# Patient Record
Sex: Female | Born: 1975
Health system: Southern US, Community
[De-identification: ages and names within clinical notes are randomized; demographics above are authoritative.]

## PROBLEM LIST (undated history)

## (undated) DIAGNOSIS — D649 Anemia, unspecified: Secondary | ICD-10-CM

## (undated) DIAGNOSIS — I739 Peripheral vascular disease, unspecified: Secondary | ICD-10-CM

## (undated) DIAGNOSIS — D6859 Other primary thrombophilia: Secondary | ICD-10-CM

## (undated) HISTORY — DX: Anemia, unspecified: D64.9

---

## 2012-06-02 ENCOUNTER — Encounter: Payer: Self-pay | Admitting: Family Medicine

## 2012-06-02 ENCOUNTER — Ambulatory Visit (INDEPENDENT_AMBULATORY_CARE_PROVIDER_SITE_OTHER): Payer: Self-pay | Admitting: Family Medicine

## 2012-06-02 VITALS — BP 117/70 | Temp 98.2°F | Wt 194.4 lb

## 2012-06-02 DIAGNOSIS — D6859 Other primary thrombophilia: Secondary | ICD-10-CM

## 2012-06-02 DIAGNOSIS — O99119 Other diseases of the blood and blood-forming organs and certain disorders involving the immune mechanism complicating pregnancy, unspecified trimester: Secondary | ICD-10-CM

## 2012-06-02 DIAGNOSIS — O09219 Supervision of pregnancy with history of pre-term labor, unspecified trimester: Secondary | ICD-10-CM

## 2012-06-02 DIAGNOSIS — O09299 Supervision of pregnancy with other poor reproductive or obstetric history, unspecified trimester: Secondary | ICD-10-CM

## 2012-06-02 DIAGNOSIS — Z8759 Personal history of other complications of pregnancy, childbirth and the puerperium: Secondary | ICD-10-CM

## 2012-06-02 DIAGNOSIS — O0992 Supervision of high risk pregnancy, unspecified, second trimester: Secondary | ICD-10-CM

## 2012-06-02 DIAGNOSIS — O34219 Maternal care for unspecified type scar from previous cesarean delivery: Secondary | ICD-10-CM

## 2012-06-02 DIAGNOSIS — O99891 Other specified diseases and conditions complicating pregnancy: Secondary | ICD-10-CM

## 2012-06-02 DIAGNOSIS — O09529 Supervision of elderly multigravida, unspecified trimester: Secondary | ICD-10-CM

## 2012-06-02 DIAGNOSIS — IMO0002 Reserved for concepts with insufficient information to code with codable children: Secondary | ICD-10-CM | POA: Insufficient documentation

## 2012-06-02 LAB — POCT URINALYSIS DIP (DEVICE)
Hgb urine dipstick: NEGATIVE
Leukocytes, UA: NEGATIVE
Nitrite: NEGATIVE
Protein, ur: NEGATIVE mg/dL
Urobilinogen, UA: 0.2 mg/dL (ref 0.0–1.0)
pH: 5 (ref 5.0–8.0)

## 2012-06-02 MED ORDER — VITAMIN B-6 25 MG PO TABS: 25.0000 mg | ORAL_TABLET | Freq: Two times a day (BID) | ORAL | Status: DC

## 2012-06-02 NOTE — Progress Notes (Signed)
Ob US anatomy scheduled on 9/20 @ 0930

## 2012-06-02 NOTE — Progress Notes (Signed)
Subjective:    Anne Curtis is being seen today for her first obstetrical visit.  This is a planned pregnancy. She is at [redacted]w[redacted]d gestation. She had her original prenatal care in [place] and is dated by a 7 or 8 week sono per patient. Her obstetrical history is significant for advanced maternal age, Protein S deficiency on labs from [place] 2008.  Pregnancy history fully reviewed. Stillbirth at 28 weeks, fetus with hydrocephalus per patient, delivered by elective c-section due to patient's history of abortions. Pt is on lovenox, aspirin, Vitamin B6 for coagulation disorder (protein S deficiency, borderline ANA). Pt has no history of blood clots or pulmonary embolism and no family history of blood clots or bleeding disorders. Her doctor in [place] put her on Lovenox and aspirin in previous 2 pregnancies (after 2 miscarriages) due to a presumed coagulation disorder and work-up showing low Protein S function (48%). She does not really know why she is taking these medications but says it has to do with the baby's nutrition.  Menstrual History: OB History    Grav Para Term Preterm Abortions TAB SAB Ect Mult Living   6 2 1 1 3  3   1       Menarche age: 30 Patient's last menstrual period was 12/17/2011.    The following portions of the patient's history were reviewed and updated as appropriate: allergies, current medications, past family history, past medical history, past social history, past surgical history and problem list.  Review of Systems Pertinent items are noted in HPI.    Objective:    BP 117/70  Temp 98.2 F (36.8 C)  Wt 194 lb 6.4 oz (88.179 kg)  LMP 12/17/2011  Gen:  WNWD, no distress HEENT:  NCAT, conjunctiva normal, EOMI Neck:  No masses, no thyromegaly CV:  RRR, no murmurs Lungs:  CTAB Abd:  Gravid, size =dates, non-tender, c-section scar GU:  Normal external genitalia. Vagina, cervix normal appearing. No blood or abnormal discharge. Pt very uncomfortable with exam, unable to  obtain pap but did collect cultures and wet prep. Unable to complete bimanual exam due to patient intolerance of procedure. Extrem:  No edema, non-tender Neuro:  Alert, oriented, no focal deficits.   See scanned lab results:  Karyotype for mom/dad, coagulation studies   Assessment:   36 y.o. [id number] at [redacted]w[redacted]d for initial OB visit here in [place].  - History fetal demise at 28 weeks - hydrocephalus. Mom and dad have normal karyotypes. -  Coagulation disorder - Protein S deficiency (48% function) - 2008 -  History of prior c-section (elective for fetal demise) -  Late prenatal care here - seen in [place]   Plan:    Initial labs drawn. Prenatal vitamins. Problem list reviewed and updated. Discussed VBAC vs repeat c-section. First c-section elective for fetal demise, pt worried about pain with vaginal delivery. Information given, pt considering options. Protein S deficiency - on lovenox, aspirin. Discussed with MFM. Does not feel protein S deficiency requires lovenox in pregnancy if no history of blood clot. Will Check ultrasound for any placental defects and likely stop lovenox. Continue baby aspirin okay per MFM. AFP3 discussed: too late for care. Role of ultrasound in pregnancy discussed; fetal survey: requested. Complete OB sono ordered since none available here in [place]. Amniocentesis discussed: not indicated. Follow up in 2 weeks. 30% of 30 min visit spent on counseling and coordination of care.

## 2012-06-02 NOTE — Progress Notes (Signed)
Pulse 110 Pt has some pelvic pain at times also has back pain.  White discharge with no itching.

## 2012-06-02 NOTE — Patient Instructions (Signed)
Vaginal Birth After Cesarean Delivery Vaginal birth after Cesarean delivery (VBAC) is giving birth vaginally after previously delivering a baby by a cesarean. In the past, if a woman had a Cesarean delivery, all births afterwards would be done by Cesarean delivery. This is no longer true. It can be safe for the mother to try a vaginal delivery after having a Cesarean. The final decision to have a VBAC or repeat Cesarean delivery should be between the patient and her caregiver. The risks and benefits can be discussed relative to the reason for, and the type of the previous Cesarean delivery. WOMEN WHO PLAN TO HAVE A VBAC SHOULD CHECK WITH THEIR DOCTOR TO BE SURE THAT:  The previous Cesarean was done with a low transverse uterine incision (not a vertical classical incision).   The birth canal is big enough for the baby.   There were no other operations on the uterus.   They will have an electronic fetal monitor (EFM) on at all times during labor.   An operating room would be available and ready in case an emergency Cesarean is needed.   A doctor and surgical nursing staff would be available at all times during labor to be ready to do an emergency Cesarean if necessary.   An anesthesiologist would be present in case an emergency Cesarean is needed.   The nursery is prepared and has adequate personnel and necessary equipment available to care for the baby in case of an emergency Cesarean.  BENEFITS OF VBAC:  Shorter stay in the hospital.   Lower delivery, nursery and hospital costs.   Less blood loss and need for blood transfusions.   Less fever and discomfort from major surgery.   Lower risk of blood clots.   Lower risk of infection.   Shorter recovery after going home.   Lower risk of other surgical complications, such as opening of the incision or hernia in the incision.   Decreased risk of injury to other organs.   Decreased risk for having to remove the uterus (hysterectomy).     Decreased risk for the placenta to completely or partially cover the opening of the uterus (placenta previa) with a future pregnancy.   Ability to have a larger family if desired.  RISKS OF A VBAC:  Rupture of the uterus.   Having to remove the uterus (hysterectomy) if it ruptures.   All the complications of major surgery and/or injury to other organs.   Excessive bleeding, blood clots and infection.   Lower Apgar scores (method to evaluate the newborn based on appearance, pulse, grimace, activity, and respiration) and more risks to the baby.   There is a higher risk of uterine rupture if you induce or augment labor.   There is a higher risk of uterine rupture if you use medications to ripen the cervix.  VBAC SHOULD NOT BE DONE IF:  The previous Cesarean was done with a vertical (classical) or T-shaped incision, or you do not know what kind of an incision was made.   You had a ruptured uterus.   You had surgery on your uterus.   You have medical or obstetrical problems.   There are problems with the baby.   There were two previous Cesarean deliveries and no vaginal deliveries.  OTHER FACTS TO KNOW ABOUT VBAC:  It is safe to have an epidural anesthetic with VBAC.   It is safe to turn the baby from a breech position (attempt an external cephalic version).   It is  safe to try a VBAC with twins.   Pregnancies later than 40 weeks have not been successful with VBAC.   There is an increased failure rate of a VBAC in obese pregnant women.   There is an increased failure rate with VABC if the baby weighs 8.8 pounds (4000 grams) or more.   There is an increased failure rate if the time between the Cesarean and VBAC is less than 19 months.   There is an increased failure rate if pre-eclampsia is present (high blood pressure, protein in the urine and swelling of face and extremities).   VBAC is very successful if there was a previous vaginal birth.   VBAC is very successful  when the labor starts spontaneously before the due date.   Delivery of VBAC is similar to having a normal spontaneous vaginal delivery.  It is important to discuss VBAC with your caregiver early in the pregnancy so you can understand the risks, benefits and options. It will give you time to decide what is best in your particular case relevant to the reason for your previous Cesarean delivery. It should be understood that medical changes in the mother or pregnancy may occur during the pregnancy, which make it necessary to change you or your caregiver's initial decision. The counseling, concerns and decisions should be documented in the medical record and signed by all parties. Document Released: 02/22/2007 Document Revised: 08/21/2011 Document Reviewed: 10/13/2008 [facility] Patient Information 2012 [place], [place].  Pregnancy - Second Trimester The second trimester of pregnancy (3 to 6 months) is a period of rapid growth for you and your baby. At the end of the sixth month, your baby is about 9 inches long and weighs 1 1/2 pounds. You will begin to feel the baby move between 18 and 20 weeks of the pregnancy. This is called quickening. Weight gain is faster. A clear fluid (colostrum) may leak out of your breasts. You may feel small contractions of the womb (uterus). This is known as false labor or Braxton-Hicks contractions. This is like a practice for labor when the baby is ready to be born. Usually, the problems with morning sickness have usually passed by the end of your first trimester. Some women develop small dark blotches (called cholasma, mask of pregnancy) on their face that usually goes away after the baby is born. Exposure to the sun makes the blotches worse. Acne may also develop in some pregnant women and pregnant women who have acne, may find that it goes away. PRENATAL EXAMS  Blood work may continue to be done during prenatal exams. These tests are done to check on your health and the probable  health of your baby. Blood work is used to follow your blood levels (hemoglobin). Anemia (low hemoglobin) is common during pregnancy. Iron and vitamins are given to help prevent this. You will also be checked for diabetes between 24 and 28 weeks of the pregnancy. Some of the previous blood tests may be repeated.   The size of the uterus is measured during each visit. This is to make sure that the baby is continuing to grow properly according to the dates of the pregnancy.   Your blood pressure is checked every prenatal visit. This is to make sure you are not getting toxemia.   Your urine is checked to make sure you do not have an infection, diabetes or protein in the urine.   Your weight is checked often to make sure gains are happening at the suggested rate. This  is to ensure that both you and your baby are growing normally.   Sometimes, an ultrasound is performed to confirm the proper growth and development of the baby. This is a test which bounces harmless sound waves off the baby so your caregiver can more accurately determine due dates.  Sometimes, a specialized test is done on the amniotic fluid surrounding the baby. This test is called an amniocentesis. The amniotic fluid is obtained by sticking a needle into the belly (abdomen). This is done to check the chromosomes in instances where there is a concern about possible genetic problems with the baby. It is also sometimes done near the end of pregnancy if an early delivery is required. In this case, it is done to help make sure the baby's lungs are mature enough for the baby to live outside of the womb. CHANGES OCCURING IN THE SECOND TRIMESTER OF PREGNANCY Your body goes through many changes during pregnancy. They vary from person to person. Talk to your caregiver about changes you notice that you are concerned about.  During the second trimester, you will likely have an increase in your appetite. It is normal to have cravings for certain foods.  This varies from person to person and pregnancy to pregnancy.   Your lower abdomen will begin to bulge.   You may have to urinate more often because the uterus and baby are pressing on your bladder. It is also common to get more bladder infections during pregnancy (pain with urination). You can help this by drinking lots of fluids and emptying your bladder before and after intercourse.   You may begin to get stretch marks on your hips, abdomen, and breasts. These are normal changes in the body during pregnancy. There are no exercises or medications to take that prevent this change.   You may begin to develop swollen and bulging veins (varicose veins) in your legs. Wearing support hose, elevating your feet for 15 minutes, 3 to 4 times a day and limiting salt in your diet helps lessen the problem.   Heartburn may develop as the uterus grows and pushes up against the stomach. Antacids recommended by your caregiver helps with this problem. Also, eating smaller meals 4 to 5 times a day helps.   Constipation can be treated with a stool softener or adding bulk to your diet. Drinking lots of fluids, vegetables, fruits, and whole grains are helpful.   Exercising is also helpful. If you have been very active up until your pregnancy, most of these activities can be continued during your pregnancy. If you have been less active, it is helpful to start an exercise program such as walking.   Hemorrhoids (varicose veins in the rectum) may develop at the end of the second trimester. Warm sitz baths and hemorrhoid cream recommended by your caregiver helps hemorrhoid problems.   Backaches may develop during this time of your pregnancy. Avoid heavy lifting, wear low heal shoes and practice good posture to help with backache problems.   Some pregnant women develop tingling and numbness of their hand and fingers because of swelling and tightening of ligaments in the wrist (carpel tunnel syndrome). This goes away after  the baby is born.   As your breasts enlarge, you may have to get a bigger bra. Get a comfortable, cotton, support bra. Do not get a nursing bra until the last month of the pregnancy if you will be nursing the baby.   You may get a dark line from your belly button  to the pubic area called the linea nigra.   You may develop rosy cheeks because of increase blood flow to the face.   You may develop spider looking lines of the face, neck, arms and chest. These go away after the baby is born.  HOME CARE INSTRUCTIONS   It is extremely important to avoid all smoking, herbs, alcohol, and unprescribed drugs during your pregnancy. These chemicals affect the formation and growth of the baby. Avoid these chemicals throughout the pregnancy to ensure the delivery of a healthy infant.   Most of your home care instructions are the same as suggested for the first trimester of your pregnancy. Keep your caregiver's appointments. Follow your caregiver's instructions regarding medication use, exercise and diet.   During pregnancy, you are providing food for you and your baby. Continue to eat regular, well-balanced meals. Choose foods such as meat, fish, milk and other low fat dairy products, vegetables, fruits, and whole-grain breads and cereals. Your caregiver will tell you of the ideal weight gain.   A physical sexual relationship may be continued up until near the end of pregnancy if there are no other problems. Problems could include early (premature) leaking of amniotic fluid from the membranes, vaginal bleeding, abdominal pain, or other medical or pregnancy problems.   Exercise regularly if there are no restrictions. Check with your caregiver if you are unsure of the safety of some of your exercises. The greatest weight gain will occur in the last 2 trimesters of pregnancy. Exercise will help you:   Control your weight.   Get you in shape for labor and delivery.   Lose weight after you have the baby.    Wear a good support or jogging bra for breast tenderness during pregnancy. This may help if worn during sleep. Pads or tissues may be used in the bra if you are leaking colostrum.   Do not use hot tubs, steam rooms or saunas throughout the pregnancy.   Wear your seat belt at all times when driving. This protects you and your baby if you are in an accident.   Avoid raw meat, uncooked cheese, cat litter boxes and soil used by cats. These carry germs that can cause birth defects in the baby.   The second trimester is also a good time to visit your dentist for your dental health if this has not been done yet. Getting your teeth cleaned is OK. Use a soft toothbrush. Brush gently during pregnancy.   It is easier to loose urine during pregnancy. Tightening up and strengthening the pelvic muscles will help with this problem. Practice stopping your urination while you are going to the bathroom. These are the same muscles you need to strengthen. It is also the muscles you would use as if you were trying to stop from passing gas. You can practice tightening these muscles up 10 times a set and repeating this about 3 times per day. Once you know what muscles to tighten up, do not perform these exercises during urination. It is more likely to contribute to an infection by backing up the urine.   Ask for help if you have financial, counseling or nutritional needs during pregnancy. Your caregiver will be able to offer counseling for these needs as well as refer you for other special needs.   Your skin may become oily. If so, wash your face with mild soap, use non-greasy moisturizer and oil or cream based makeup.  MEDICATIONS AND DRUG USE IN PREGNANCY  Take prenatal  vitamins as directed. The vitamin should contain 1 milligram of folic acid. Keep all vitamins out of reach of children. Only a couple vitamins or tablets containing iron may be fatal to a baby or young child when ingested.   Avoid use of all  medications, including herbs, over-the-counter medications, not prescribed or suggested by your caregiver. Only take over-the-counter or prescription medicines for pain, discomfort, or fever as directed by your caregiver. Do not use aspirin.   Let your caregiver also know about herbs you may be using.   Alcohol is related to a number of birth defects. This includes fetal alcohol syndrome. All alcohol, in any form, should be avoided completely. Smoking will cause low birth rate and premature babies.   Street or illegal drugs are very harmful to the baby. They are absolutely forbidden. A baby born to an addicted mother will be addicted at birth. The baby will go through the same withdrawal an adult does.  SEEK MEDICAL CARE IF:  You have any concerns or worries during your pregnancy. It is better to call with your questions if you feel they cannot wait, rather than worry about them. SEEK IMMEDIATE MEDICAL CARE IF:   An unexplained oral temperature above 102 F (38.9 C) develops, or as your caregiver suggests.   You have leaking of fluid from the vagina (birth canal). If leaking membranes are suspected, take your temperature and tell your caregiver of this when you call.   There is vaginal spotting, bleeding, or passing clots. Tell your caregiver of the amount and how many pads are used. Light spotting in pregnancy is common, especially following intercourse.   You develop a bad smelling vaginal discharge with a change in the color from clear to white.   You continue to feel sick to your stomach (nauseated) and have no relief from remedies suggested. You vomit blood or coffee ground-like materials.   You lose more than 2 pounds of weight or gain more than 2 pounds of weight over 1 week, or as suggested by your caregiver.   You notice swelling of your face, hands, feet, or legs.   You get exposed to [place] measles and have never had them.   You are exposed to fifth disease or chickenpox.    You develop belly (abdominal) pain. Round ligament discomfort is a common non-cancerous (benign) cause of abdominal pain in pregnancy. Your caregiver still must evaluate you.   You develop a bad headache that does not go away.   You develop fever, diarrhea, pain with urination, or shortness of breath.   You develop visual problems, blurry, or double vision.   You fall or are in a car accident or any kind of trauma.   There is mental or physical violence at home.  Document Released: 08/26/2001 Document Revised: 08/21/2011 Document Reviewed: 02/28/2009 [facility] Patient Information 2012 [place], [place].

## 2012-06-03 ENCOUNTER — Telehealth: Payer: Self-pay | Admitting: *Deleted

## 2012-06-03 LAB — OBSTETRIC PANEL
Basophils Absolute: 0 10*3/uL (ref 0.0–0.1)
Basophils Relative: 0 % (ref 0–1)
Eosinophils Absolute: 0.2 10*3/uL (ref 0.0–0.7)
Eosinophils Relative: 2 % (ref 0–5)
MCH: 19.9 pg — ABNORMAL LOW (ref 26.0–34.0)
MCV: 65.4 fL — ABNORMAL LOW (ref 78.0–100.0)
Platelets: 334 10*3/uL (ref 150–400)
RDW: 18.3 % — ABNORMAL HIGH (ref 11.5–15.5)

## 2012-06-03 LAB — GLUCOSE TOLERANCE, 1 HOUR (50G) W/O FASTING: Glucose, 1 Hour GTT: 141 mg/dL — ABNORMAL HIGH (ref 70–140)

## 2012-06-03 LAB — GC/CHLAMYDIA PROBE AMP, GENITAL
Chlamydia, DNA Probe: NEGATIVE
GC Probe Amp, Genital: NEGATIVE

## 2012-06-03 NOTE — Telephone Encounter (Addendum)
Called client with Anne Curtis [phone] and notified her she did not pass 1 hour glucose screen and needs 3 hr screen - explained must be npo except sips of water if needed- agreed to appointment for Monday. Also discussed she is still anemic- patient states taking iron once a day- instructed her to take iron 2 to 3 times a day with meals. Also discussed may cause constipation and if it does let us know, will order her a medication.  Patient voices understanding, all questions answered.

## 2012-06-03 NOTE — Telephone Encounter (Deleted)
Message copied by Gerome Apley on Thu Jun 03, 2012  3:37 PM ------      Message from: FERRY, [place]      Created: Thu Jun 03, 2012 10:52 AM       Failed 1 hour GTT. Patient needs 3 hour GTT. Also, patient is still anemic. Needs to be on iron bid to tid. Thanks.

## 2012-06-03 NOTE — Telephone Encounter (Signed)
Message copied by Gerome Apley on Thu Jun 03, 2012  2:56 PM ------      Message from: FERRY, [place]      Created: Thu Jun 03, 2012 10:52 AM       Failed 1 hour GTT. Patient needs 3 hour GTT. Also, patient is still anemic. Needs to be on iron bid to tid. Thanks.

## 2012-06-04 ENCOUNTER — Ambulatory Visit (HOSPITAL_COMMUNITY)
Admission: RE | Admit: 2012-06-04 | Discharge: 2012-06-04 | Disposition: A | Discharge status: Home / Self Care | Payer: Medicaid Other | Source: Ambulatory Visit | Attending: Family Medicine | Admitting: Family Medicine

## 2012-06-04 DIAGNOSIS — Z8759 Personal history of other complications of pregnancy, childbirth and the puerperium: Secondary | ICD-10-CM

## 2012-06-04 DIAGNOSIS — O09299 Supervision of pregnancy with other poor reproductive or obstetric history, unspecified trimester: Secondary | ICD-10-CM | POA: Insufficient documentation

## 2012-06-04 DIAGNOSIS — O0992 Supervision of high risk pregnancy, unspecified, second trimester: Secondary | ICD-10-CM

## 2012-06-04 DIAGNOSIS — Z8751 Personal history of pre-term labor: Secondary | ICD-10-CM | POA: Insufficient documentation

## 2012-06-04 DIAGNOSIS — O358XX0 Maternal care for other (suspected) fetal abnormality and damage, not applicable or unspecified: Secondary | ICD-10-CM | POA: Insufficient documentation

## 2012-06-04 LAB — CULTURE, OB URINE: Colony Count: 50000

## 2012-06-04 LAB — WET PREP, GENITAL

## 2012-06-04 LAB — HEMOGLOBINOPATHY EVALUATION: Hgb F Quant: 0 % (ref 0.0–2.0)

## 2012-06-07 ENCOUNTER — Other Ambulatory Visit: Payer: Self-pay

## 2012-06-07 DIAGNOSIS — R7302 Impaired glucose tolerance (oral): Secondary | ICD-10-CM

## 2012-06-08 LAB — GLUCOSE TOLERANCE, 3 HOURS: Glucose Tolerance, 2 hour: 168 mg/dL — ABNORMAL HIGH (ref 70–164)

## 2012-06-17 ENCOUNTER — Ambulatory Visit (INDEPENDENT_AMBULATORY_CARE_PROVIDER_SITE_OTHER): Payer: Self-pay | Admitting: Family Medicine

## 2012-06-17 ENCOUNTER — Encounter: Payer: Self-pay | Admitting: Family Medicine

## 2012-06-17 VITALS — BP 98/64 | Temp 97.8°F | Ht 64.75 in | Wt 195.9 lb

## 2012-06-17 DIAGNOSIS — IMO0002 Reserved for concepts with insufficient information to code with codable children: Secondary | ICD-10-CM

## 2012-06-17 DIAGNOSIS — O09529 Supervision of elderly multigravida, unspecified trimester: Secondary | ICD-10-CM

## 2012-06-17 DIAGNOSIS — Z23 Encounter for immunization: Secondary | ICD-10-CM

## 2012-06-17 DIAGNOSIS — O34219 Maternal care for unspecified type scar from previous cesarean delivery: Secondary | ICD-10-CM

## 2012-06-17 DIAGNOSIS — O0990 Supervision of high risk pregnancy, unspecified, unspecified trimester: Secondary | ICD-10-CM | POA: Insufficient documentation

## 2012-06-17 LAB — POCT URINALYSIS DIP (DEVICE)
Bilirubin Urine: NEGATIVE
Ketones, ur: NEGATIVE mg/dL
Leukocytes, UA: NEGATIVE
Protein, ur: NEGATIVE mg/dL
Specific Gravity, Urine: 1.02 (ref 1.005–1.030)
pH: 5.5 (ref 5.0–8.0)

## 2012-06-17 MED ORDER — INFLUENZA VIRUS VACC SPLIT PF IM SUSP
0.5000 mL | Freq: Once | INTRAMUSCULAR | Status: AC
  Administered 2012-06-17: 0.5 mL via INTRAMUSCULAR

## 2012-06-17 NOTE — Patient Instructions (Addendum)
Vaginal Birth After Cesarean Delivery Vaginal birth after Cesarean delivery (VBAC) is giving birth vaginally after previously delivering a baby by a cesarean. In the past, if a woman had a Cesarean delivery, all births afterwards would be done by Cesarean delivery. This is no longer true. It can be safe for the mother to try a vaginal delivery after having a Cesarean. The final decision to have a VBAC or repeat Cesarean delivery should be between the patient and her caregiver. The risks and benefits can be discussed relative to the reason for, and the type of the previous Cesarean delivery. WOMEN WHO PLAN TO HAVE A VBAC SHOULD CHECK WITH THEIR DOCTOR TO BE SURE THAT:  The previous Cesarean was done with a low transverse uterine incision (not a vertical classical incision).  The birth canal is big enough for the baby.  There were no other operations on the uterus.  They will have an electronic fetal monitor (EFM) on at all times during labor.  An operating room would be available and ready in case an emergency Cesarean is needed.  A doctor and surgical nursing staff would be available at all times during labor to be ready to do an emergency Cesarean if necessary.  An anesthesiologist would be present in case an emergency Cesarean is needed.  The nursery is prepared and has adequate personnel and necessary equipment available to care for the baby in case of an emergency Cesarean. BENEFITS OF VBAC:  Shorter stay in the hospital.  Lower delivery, nursery and hospital costs.  Less blood loss and need for blood transfusions.  Less fever and discomfort from major surgery.  Lower risk of blood clots.  Lower risk of infection.  Shorter recovery after going home.  Lower risk of other surgical complications, such as opening of the incision or hernia in the incision.  Decreased risk of injury to other organs.  Decreased risk for having to remove the uterus (hysterectomy).  Decreased risk  for the placenta to completely or partially cover the opening of the uterus (placenta previa) with a future pregnancy.  Ability to have a larger family if desired. RISKS OF A VBAC:  Rupture of the uterus.  Having to remove the uterus (hysterectomy) if it ruptures.  All the complications of major surgery and/or injury to other organs.  Excessive bleeding, blood clots and infection.  Lower Apgar scores (method to evaluate the newborn based on appearance, pulse, grimace, activity, and respiration) and more risks to the baby.  There is a higher risk of uterine rupture if you induce or augment labor.  There is a higher risk of uterine rupture if you use medications to ripen the cervix. VBAC SHOULD NOT BE DONE IF:  The previous Cesarean was done with a vertical (classical) or T-shaped incision, or you do not know what kind of an incision was made.  You had a ruptured uterus.  You had surgery on your uterus.  You have medical or obstetrical problems.  There are problems with the baby.  There were two previous Cesarean deliveries and no vaginal deliveries. OTHER FACTS TO KNOW ABOUT VBAC:  It is safe to have an epidural anesthetic with VBAC.  It is safe to turn the baby from a breech position (attempt an external cephalic version).  It is safe to try a VBAC with twins.  Pregnancies later than 40 weeks have not been successful with VBAC.  There is an increased failure rate of a VBAC in obese pregnant women.  There is   an increased failure rate with VABC if the baby weighs 8.8 pounds (4000 grams) or more.  There is an increased failure rate if the time between the Cesarean and VBAC is less than 19 months.  There is an increased failure rate if pre-eclampsia is present (high blood pressure, protein in the urine and swelling of face and extremities).  VBAC is very successful if there was a previous vaginal birth.  VBAC is very successful when the labor starts spontaneously before  the due date.  Delivery of VBAC is similar to having a normal spontaneous vaginal delivery. It is important to discuss VBAC with your caregiver early in the pregnancy so you can understand the risks, benefits and options. It will give you time to decide what is best in your particular case relevant to the reason for your previous Cesarean delivery. It should be understood that medical changes in the mother or pregnancy may occur during the pregnancy, which make it necessary to change you or your caregiver's initial decision. The counseling, concerns and decisions should be documented in the medical record and signed by all parties. Document Released: 02/22/2007 Document Revised: 11/24/2011 Document Reviewed: 10/13/2008 [employer] Patient Information 2013 [employer].  Pregnancy - Second Trimester The second trimester of pregnancy (3 to 6 months) is a period of rapid growth for you and your baby. At the end of the sixth month, your baby is about 9 inches long and weighs 1 1/2 pounds. You will begin to feel the baby move between 18 and 20 weeks of the pregnancy. This is called quickening. Weight gain is faster. A clear fluid (colostrum) may leak out of your breasts. You may feel small contractions of the womb (uterus). This is known as false labor or Braxton-Hicks contractions. This is like a practice for labor when the baby is ready to be born. Usually, the problems with morning sickness have usually passed by the end of your first trimester. Some women develop small dark blotches (called cholasma, mask of pregnancy) on their face that usually goes away after the baby is born. Exposure to the sun makes the blotches worse. Acne may also develop in some pregnant women and pregnant women who have acne, may find that it goes away. PRENATAL EXAMS  Blood work may continue to be done during prenatal exams. These tests are done to check on your health and the probable health of your baby. Blood work is used to  follow your blood levels (hemoglobin). Anemia (low hemoglobin) is common during pregnancy. Iron and vitamins are given to help prevent this. You will also be checked for diabetes between 24 and 28 weeks of the pregnancy. Some of the previous blood tests may be repeated.  The size of the uterus is measured during each visit. This is to make sure that the baby is continuing to grow properly according to the dates of the pregnancy.  Your blood pressure is checked every prenatal visit. This is to make sure you are not getting toxemia.  Your urine is checked to make sure you do not have an infection, diabetes or protein in the urine.  Your weight is checked often to make sure gains are happening at the suggested rate. This is to ensure that both you and your baby are growing normally.  Sometimes, an ultrasound is performed to confirm the proper growth and development of the baby. This is a test which bounces harmless sound waves off the baby so your caregiver can more accurately determine due dates.   Sometimes, a specialized test is done on the amniotic fluid surrounding the baby. This test is called an amniocentesis. The amniotic fluid is obtained by sticking a needle into the belly (abdomen). This is done to check the chromosomes in instances where there is a concern about possible genetic problems with the baby. It is also sometimes done near the end of pregnancy if an early delivery is required. In this case, it is done to help make sure the baby's lungs are mature enough for the baby to live outside of the womb. CHANGES OCCURING IN THE SECOND TRIMESTER OF PREGNANCY Your body goes through many changes during pregnancy. They vary from person to person. Talk to your caregiver about changes you notice that you are concerned about.  During the second trimester, you will likely have an increase in your appetite. It is normal to have cravings for certain foods. This varies from person to person and pregnancy  to pregnancy.  Your lower abdomen will begin to bulge.  You may have to urinate more often because the uterus and baby are pressing on your bladder. It is also common to get more bladder infections during pregnancy (pain with urination). You can help this by drinking lots of fluids and emptying your bladder before and after intercourse.  You may begin to get stretch marks on your hips, abdomen, and breasts. These are normal changes in the body during pregnancy. There are no exercises or medications to take that prevent this change.  You may begin to develop swollen and bulging veins (varicose veins) in your legs. Wearing support hose, elevating your feet for 15 minutes, 3 to 4 times a day and limiting salt in your diet helps lessen the problem.  Heartburn may develop as the uterus grows and pushes up against the stomach. Antacids recommended by your caregiver helps with this problem. Also, eating smaller meals 4 to 5 times a day helps.  Constipation can be treated with a stool softener or adding bulk to your diet. Drinking lots of fluids, vegetables, fruits, and whole grains are helpful.  Exercising is also helpful. If you have been very active up until your pregnancy, most of these activities can be continued during your pregnancy. If you have been less active, it is helpful to start an exercise program such as walking.  Hemorrhoids (varicose veins in the rectum) may develop at the end of the second trimester. Warm sitz baths and hemorrhoid cream recommended by your caregiver helps hemorrhoid problems.  Backaches may develop during this time of your pregnancy. Avoid heavy lifting, wear low heal shoes and practice good posture to help with backache problems.  Some pregnant women develop tingling and numbness of their hand and fingers because of swelling and tightening of ligaments in the wrist (carpel tunnel syndrome). This goes away after the baby is born.  As your breasts enlarge, you may  have to get a bigger bra. Get a comfortable, cotton, support bra. Do not get a nursing bra until the last month of the pregnancy if you will be nursing the baby.  You may get a dark line from your belly button to the pubic area called the linea nigra.  You may develop rosy cheeks because of increase blood flow to the face.  You may develop spider looking lines of the face, neck, arms and chest. These go away after the baby is born. HOME CARE INSTRUCTIONS   It is extremely important to avoid all smoking, herbs, alcohol, and unprescribed drugs during   your pregnancy. These chemicals affect the formation and growth of the baby. Avoid these chemicals throughout the pregnancy to ensure the delivery of a healthy infant.  Most of your home care instructions are the same as suggested for the first trimester of your pregnancy. Keep your caregiver's appointments. Follow your caregiver's instructions regarding medication use, exercise and diet.  During pregnancy, you are providing food for you and your baby. Continue to eat regular, well-balanced meals. Choose foods such as meat, fish, milk and other low fat dairy products, vegetables, fruits, and whole-grain breads and cereals. Your caregiver will tell you of the ideal weight gain.  A physical sexual relationship may be continued up until near the end of pregnancy if there are no other problems. Problems could include early (premature) leaking of amniotic fluid from the membranes, vaginal bleeding, abdominal pain, or other medical or pregnancy problems.  Exercise regularly if there are no restrictions. Check with your caregiver if you are unsure of the safety of some of your exercises. The greatest weight gain will occur in the last 2 trimesters of pregnancy. Exercise will help you:  Control your weight.  Get you in shape for labor and delivery.  Lose weight after you have the baby.  Wear a good support or jogging bra for breast tenderness during  pregnancy. This may help if worn during sleep. Pads or tissues may be used in the bra if you are leaking colostrum.  Do not use hot tubs, steam rooms or saunas throughout the pregnancy.  Wear your seat belt at all times when driving. This protects you and your baby if you are in an accident.  Avoid raw meat, uncooked cheese, cat litter boxes and soil used by cats. These carry germs that can cause birth defects in the baby.  The second trimester is also a good time to visit your dentist for your dental health if this has not been done yet. Getting your teeth cleaned is OK. Use a soft toothbrush. Brush gently during pregnancy.  It is easier to loose urine during pregnancy. Tightening up and strengthening the pelvic muscles will help with this problem. Practice stopping your urination while you are going to the bathroom. These are the same muscles you need to strengthen. It is also the muscles you would use as if you were trying to stop from passing gas. You can practice tightening these muscles up 10 times a set and repeating this about 3 times per day. Once you know what muscles to tighten up, do not perform these exercises during urination. It is more likely to contribute to an infection by backing up the urine.  Ask for help if you have financial, counseling or nutritional needs during pregnancy. Your caregiver will be able to offer counseling for these needs as well as refer you for other special needs.  Your skin may become oily. If so, wash your face with mild soap, use non-greasy moisturizer and oil or cream based makeup. MEDICATIONS AND DRUG USE IN PREGNANCY  Take prenatal vitamins as directed. The vitamin should contain 1 milligram of folic acid. Keep all vitamins out of reach of children. Only a couple vitamins or tablets containing iron may be fatal to a baby or young child when ingested.  Avoid use of all medications, including herbs, over-the-counter medications, not prescribed or  suggested by your caregiver. Only take over-the-counter or prescription medicines for pain, discomfort, or fever as directed by your caregiver. Do not use aspirin.  Let your caregiver also   know about herbs you may be using.  Alcohol is related to a number of birth defects. This includes fetal alcohol syndrome. All alcohol, in any form, should be avoided completely. Smoking will cause low birth rate and premature babies.  Street or illegal drugs are very harmful to the baby. They are absolutely forbidden. A baby born to an addicted mother will be addicted at birth. The baby will go through the same withdrawal an adult does. SEEK MEDICAL CARE IF:  You have any concerns or worries during your pregnancy. It is better to call with your questions if you feel they cannot wait, rather than worry about them. SEEK IMMEDIATE MEDICAL CARE IF:   An unexplained oral temperature above 102 F (38.9 C) develops, or as your caregiver suggests.  You have leaking of fluid from the vagina (birth canal). If leaking membranes are suspected, take your temperature and tell your caregiver of this when you call.  There is vaginal spotting, bleeding, or passing clots. Tell your caregiver of the amount and how many pads are used. Light spotting in pregnancy is common, especially following intercourse.  You develop a bad smelling vaginal discharge with a change in the color from clear to white.  You continue to feel sick to your stomach (nauseated) and have no relief from remedies suggested. You vomit blood or coffee ground-like materials.  You lose more than 2 pounds of weight or gain more than 2 pounds of weight over 1 week, or as suggested by your caregiver.  You notice swelling of your face, hands, feet, or legs.  You get exposed to German measles and have never had them.  You are exposed to fifth disease or chickenpox.  You develop belly (abdominal) pain. Round ligament discomfort is a common non-cancerous  (benign) cause of abdominal pain in pregnancy. Your caregiver still must evaluate you.  You develop a bad headache that does not go away.  You develop fever, diarrhea, pain with urination, or shortness of breath.  You develop visual problems, blurry, or double vision.  You fall or are in a car accident or any kind of trauma.  There is mental or physical violence at home. Document Released: 08/26/2001 Document Revised: 11/24/2011 Document Reviewed: 02/28/2009 [employer] Patient Information 2013 [employer].  Breastfeeding Deciding to breastfeed is one of the best choices you can make for you and your baby. The information that follows gives a brief overview of the benefits of breastfeeding as well as common topics surrounding breastfeeding. BENEFITS OF BREASTFEEDING For the baby  The first milk (colostrum) helps the baby's digestive system function better.   There are antibodies in the mother's milk that help the baby fight off infections.   The baby has a lower incidence of asthma, allergies, and sudden infant death syndrome (SIDS).   The nutrients in breast milk are better for the baby than infant formulas, and breast milk helps the baby's brain grow better.   Babies who breastfeed have less gas, colic, and constipation.  For the mother  Breastfeeding helps develop a very special bond between the mother and her baby.   Breastfeeding is convenient, always available at the correct temperature, and costs nothing.   Breastfeeding burns calories in the mother and helps her lose weight that was gained during pregnancy.   Breastfeeding makes the uterus contract back down to normal size faster and slows bleeding following delivery.   Breastfeeding mothers have a lower risk of developing breast cancer.  BREASTFEEDING FREQUENCY  A healthy, full-term   baby may breastfeed as often as every hour or space his or her feedings to every 3 hours.   Watch your baby for signs of  hunger. Nurse your baby if he or she shows signs of hunger. How often you nurse will vary from baby to baby.   Nurse as often as the baby requests, or when you feel the need to reduce the fullness of your breasts.   Awaken the baby if it has been 3 4 hours since the last feeding.   Frequent feeding will help the mother make more milk and will help prevent problems, such as sore nipples and engorgement of the breasts.  BABY'S POSITION AT THE BREAST  Whether lying down or sitting, be sure that the baby's tummy is facing your tummy.   Support the breast with 4 fingers underneath the breast and the thumb above. Make sure your fingers are well away from the nipple and baby's mouth.   Stroke the baby's lips gently with your finger or nipple.   When the baby's mouth is open wide enough, place all of your nipple and as much of the areola as possible into your baby's mouth.   Pull the baby in close so the tip of the nose and the baby's cheeks touch the breast during the feeding.  FEEDINGS AND SUCTION  The length of each feeding varies from baby to baby and from feeding to feeding.   The baby must suck about 2 3 minutes for your milk to get to him or her. This is called a "let down." For this reason, allow the baby to feed on each breast as long as he or she wants. Your baby will end the feeding when he or she has received the right balance of nutrients.   To break the suction, put your finger into the corner of the baby's mouth and slide it between his or her gums before removing your breast from his or her mouth. This will help prevent sore nipples.  HOW TO TELL WHETHER YOUR BABY IS GETTING ENOUGH BREAST MILK. Wondering whether or not your baby is getting enough milk is a common concern among mothers. You can be assured that your baby is getting enough milk if:   Your baby is actively sucking and you hear swallowing.   Your baby seems relaxed and satisfied after a feeding.    Your baby nurses at least 8 12 times in a 24 hour time period. Nurse your baby until he or she unlatches or falls asleep at the first breast (at least 10 20 minutes), then offer the second side.   Your baby is wetting 5 6 disposable diapers (6 8 cloth diapers) in a 24 hour period by 5 6 days of age.   Your baby is having at least 3 4 stools every 24 hours for the first 6 weeks. The stool should be soft and yellow.   Your baby should gain 4 7 ounces per week after he or she is 4 days old.   Your breasts feel softer after nursing.  REDUCING BREAST ENGORGEMENT  In the first week after your baby is born, you may experience signs of breast engorgement. When breasts are engorged, they feel heavy, warm, full, and may be tender to the touch. You can reduce engorgement if you:   Nurse frequently, every 2 3 hours. Mothers who breastfeed early and often have fewer problems with engorgement.   Place light ice packs on your breasts for 10 20 minutes   between feedings. This reduces swelling. Wrap the ice packs in a lightweight towel to protect your skin. Bags of frozen vegetables work well for this purpose.   Take a warm shower or apply warm, moist heat to your breast for 5 10 minutes just before each feeding. This increases circulation and helps the milk flow.   Gently massage your breast before and during the feeding. Using your finger tips, massage from the chest wall towards your nipple in a circular motion.   Make sure that the baby empties at least one breast at every feeding before switching sides.   Use a breast pump to empty the breasts if your baby is sleepy or not nursing well. You may also want to pump if you are returning to work oryou feel you are getting engorged.   Avoid bottle feeds, pacifiers, or supplemental feedings of water or juice in place of breastfeeding. Breast milk is all the food your baby needs. It is not necessary for your baby to have water or formula. In  fact, to help your breasts make more milk, it is best not to give your baby supplemental feedings during the early weeks.   Be sure the baby is latched on and positioned properly while breastfeeding.   Wear a supportive bra, avoiding underwire styles.   Eat a balanced diet with enough fluids.   Rest often, relax, and take your prenatal vitamins to prevent fatigue, stress, and anemia.  If you follow these suggestions, your engorgement should improve in 24 48 hours. If you are still experiencing difficulty, call your lactation consultant or caregiver.  CARING FOR YOURSELF Take care of your breasts  Bathe or shower daily.   Avoid using soap on your nipples.   Start feedings on your left breast at one feeding and on your right breast at the next feeding.   You will notice an increase in your milk supply 2 5 days after delivery. You may feel some discomfort from engorgement, which makes your breasts very firm and often tender. Engorgement "peaks" out within 24 48 hours. In the meantime, apply warm moist towels to your breasts for 5 10 minutes before feeding. Gentle massage and expression of some milk before feeding will soften your breasts, making it easier for your baby to latch on.   Wear a well-fitting nursing bra, and air dry your nipples for a 3 4minutes after each feeding.   Only use cotton bra pads.   Only use pure lanolin on your nipples after nursing. You do not need to wash it off before feeding the baby again. Another option is to express a few drops of breast milk and gently massage it into your nipples.  Take care of yourself  Eat well-balanced meals and nutritious snacks.   Drinking milk, fruit juice, and water to satisfy your thirst (about 8 glasses a day).   Get plenty of rest.  Avoid foods that you notice affect the baby in a bad way.  SEEK MEDICAL CARE IF:   You have difficulty with breastfeeding and need help.   You have a hard, red, sore area on  your breast that is accompanied by a fever.   Your baby is too sleepy to eat well or is having trouble sleeping.   Your baby is wetting less than 6 diapers a day, by 5 days of age.   Your baby's skin or white part of his or her eyes is more yellow than it was in the hospital.   You   feel depressed.  Document Released: 09/01/2005 Document Revised: 03/02/2012 Document Reviewed: 11/30/2011 [employer] Patient Information 2013 [employer].  

## 2012-06-17 NOTE — Progress Notes (Signed)
P=101. 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 Discussed BMI and appropriate weight gain.

## 2012-06-17 NOTE — Progress Notes (Signed)
Pt. Is doing well. Discussed TOLAC vs. RCS--still considering. Desires genetics for AMA Flu shot today Continue lovenox-due to SAB x 3 including one in second trimester and h/o IUFD.

## 2012-07-01 ENCOUNTER — Ambulatory Visit (INDEPENDENT_AMBULATORY_CARE_PROVIDER_SITE_OTHER): Payer: Self-pay | Admitting: Obstetrics & Gynecology

## 2012-07-01 VITALS — BP 96/62 | Temp 97.6°F

## 2012-07-01 DIAGNOSIS — D649 Anemia, unspecified: Secondary | ICD-10-CM

## 2012-07-01 DIAGNOSIS — IMO0002 Reserved for concepts with insufficient information to code with codable children: Secondary | ICD-10-CM

## 2012-07-01 DIAGNOSIS — Z8759 Personal history of other complications of pregnancy, childbirth and the puerperium: Secondary | ICD-10-CM

## 2012-07-01 DIAGNOSIS — O09299 Supervision of pregnancy with other poor reproductive or obstetric history, unspecified trimester: Secondary | ICD-10-CM

## 2012-07-01 DIAGNOSIS — O09529 Supervision of elderly multigravida, unspecified trimester: Secondary | ICD-10-CM

## 2012-07-01 LAB — POCT URINALYSIS DIP (DEVICE)
Glucose, UA: NEGATIVE mg/dL
Hgb urine dipstick: NEGATIVE
Nitrite: NEGATIVE
Protein, ur: NEGATIVE mg/dL
Specific Gravity, Urine: 1.02 (ref 1.005–1.030)
Urobilinogen, UA: 0.2 mg/dL (ref 0.0–1.0)
pH: 6 (ref 5.0–8.0)

## 2012-07-01 LAB — GLUCOSE, CAPILLARY: Glucose-Capillary: 93 mg/dL (ref 70–99)

## 2012-07-01 MED ORDER — TETANUS-DIPHTH-ACELL PERTUSSIS 5-2.5-18.5 LF-MCG/0.5 IM SUSP
0.5000 mL | Freq: Once | INTRAMUSCULAR | Status: AC
  Administered 2012-07-01: 0.5 mL via INTRAMUSCULAR

## 2012-07-01 MED ORDER — DOCUSATE SODIUM 100 MG PO CAPS: 100.0000 mg | ORAL_CAPSULE | Freq: Two times a day (BID) | ORAL | Status: DC

## 2012-07-01 MED ORDER — FERROUS SULFATE 325 (65 FE) MG PO TABS: 325.0000 mg | ORAL_TABLET | Freq: Two times a day (BID) | ORAL | Status: DC

## 2012-07-01 NOTE — Progress Notes (Signed)
Appt. Scheduled with MFM 07/06/12 at 3pm.

## 2012-07-01 NOTE — Progress Notes (Signed)
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 Discussed and desires TDap .

## 2012-07-01 NOTE — Progress Notes (Signed)
MFM referral for stillbirth at 28 weeks and 16 weeks.  2 miscarriages in first trimester.  Decreased Protein C 2 months pp.  The second and thrid trimester losses had hydroceph.  Anemia work.  2 hr cbg = 93  Start iron and colace.

## 2012-07-06 ENCOUNTER — Ambulatory Visit (HOSPITAL_COMMUNITY)
Admission: RE | Admit: 2012-07-06 | Discharge: 2012-07-06 | Disposition: A | Discharge status: Home / Self Care | Payer: Medicaid Other | Source: Ambulatory Visit | Attending: Obstetrics & Gynecology | Admitting: Obstetrics & Gynecology

## 2012-07-06 ENCOUNTER — Encounter (HOSPITAL_COMMUNITY): Payer: Self-pay

## 2012-07-06 VITALS — BP 109/60 | HR 110 | Wt 196.5 lb

## 2012-07-06 DIAGNOSIS — O99119 Other diseases of the blood and blood-forming organs and certain disorders involving the immune mechanism complicating pregnancy, unspecified trimester: Secondary | ICD-10-CM

## 2012-07-06 DIAGNOSIS — O34219 Maternal care for unspecified type scar from previous cesarean delivery: Secondary | ICD-10-CM

## 2012-07-06 DIAGNOSIS — O0990 Supervision of high risk pregnancy, unspecified, unspecified trimester: Secondary | ICD-10-CM

## 2012-07-06 DIAGNOSIS — IMO0002 Reserved for concepts with insufficient information to code with codable children: Secondary | ICD-10-CM

## 2012-07-06 DIAGNOSIS — Z8759 Personal history of other complications of pregnancy, childbirth and the puerperium: Secondary | ICD-10-CM

## 2012-07-06 DIAGNOSIS — D689 Coagulation defect, unspecified: Secondary | ICD-10-CM

## 2012-07-06 NOTE — Consult Note (Signed)
MFM consult  37 yr old [id number] at [redacted]w[redacted]d presents for consult regarding protein S deficiency and recurrent pregnancy loss. Patient reports no complications thus far in this pregnancy.  Past OB hx: 1st pregnancy: fetal demise in the second trimester; female fetus with hydrocephalus 2nd pregnancy: first trimester miscarriage 3rd pregnancy: fetal demise at 54 weeks; female fetus with hydrocephalus 4th pregnancy: full term C section; female; was managed on fragmin, aspirin, and folic acid 5th pregnancy: first trimester miscarriage  No other significant medical, surgical, social, or family history. Patient reports no personal or family history of venous thromboembolism  Patient had a work up for her recurrent losses: - found to have protein S deficiency - MTHFR heterozygote with normal homocysteine levels - the remainder of the thrombophilia work up is normal - normal parental karyotypes - patient reports no autopsies or genetic testing done on the 2 fetuses with hydrocephalus- diagnosis was made prenatally  This pregnancy patient has been on aspirin, fragmin, and 10mg /day folic acid   I counseled the patient as follows:  1. Advanced maternal age: - see genetic counseling report - briefly discussed association with increased risk of fetal aneuploidy - patient declined further screening and amniocentesis - previously had normal anatomic survey  2. Protein S deficiency: - discussed this is considered a low risk thrombophilia and without a personal or family history of venous thromboembolism treatment during pregnancy is not recommended - discussed the risk of venous thromboembolism is increased over baseline but is still overall low. - there may also be an association with an increased risk of late fetal demise - patient wishes to stay on her aspirin and fragmin as she had a healthy baby on this regimen prior - I do not think this is unreasonable but again discussed with the patient that it is  likely not necessary and is not our recommendation - discussed slight increased risk of bleeding complications; but overall risk is low around 3% - if patient stays on these medications would recommend discontinuing aspirin at [redacted] weeks gestation and fragmin 24 hours prior to repeat C section (pateint reports she desires repeat C section) - discussed the highest risk of venous thromboembolism would be postpartum and would recommend therapy with prophylactic lovenox or fragmin for 6 weeks following delivery if patient has other risk factors such as C section - also would consider continuing therapy if patient has antenatal risk factors such as prolonged bedrest  3. MTHFR heterozygote: - discussed this does not increase the risk of adverse pregnancy outcomes and with a normal homocysteine level does not really increase maternal adverse events - recommend continue folic acid supplementation; although 1mg /day would likely be sufficient  4. Pregnancy losses: - discussed that etiology of all of her losses is not certain although suspect the 2 female fetuses that had demised in the second and third trimester were due to the anomaly (hydrocephalus) and not to protein S deficiency - see genetic counselor note - discussed there is likely an increased risk of hydrocehpalus given the history; although patient had reportedly normal anatomy ultrasound - discussed protein S deficiency is not associated with increased risk of first trimester loss  Recommendations: - given thrombophilia would recommend ultrasounds for fetal growth every 4 weeks - can consider antenatal testing given the history of third trimester loss although again this was likely due to fetal malformation but can consider starting antenatal testing - recommend fetal kick counts - recommend reevaluation of fetal lateral ventricles on follow up ultrasounds given history of  2 prior affected fetuses (female and this fetus is female) - do not feel patient  needs to be on fragmin and aspirin but patient would like to continue these medications - recommend repeat C section at 39 weeks (if patient desires repeat C section)- do not feel that delivery prior to 39 weeks is indicated if the clinical picture remains normal  I spent 40 minutes in face to face consultation with the patient.  Eulis Foster, MD

## 2012-07-07 NOTE — Progress Notes (Addendum)
Genetic Counseling  High-Risk Gestation Note  Appointment Date:  07/06/2012 Referred By: Lesly Dukes, MD Date of Birth:  10/20/1975 Partner:  Fritzi Mandes    Pregnancy History: [id number] Estimated Date of Delivery: 09/22/12 Estimated Gestational Age: [redacted]w[redacted]d Attending: Eulis Foster, MD  Anne Curtis and her husband were seen for genetic counseling because of a maternal age of 36 y.o..   Arabic/English interpreter was present for today's visit. The patient communicated in English for the majority of the session without assistance from the interpreter.    They were counseled regarding maternal age and the association with risk for chromosome conditions due to nondisjunction with aging of the ova.  We reviewed chromosomes, nondisjunction, and the associated 1 in 111 risk for fetal aneuploidy related to a maternal age of 36 y.o. at [redacted]w[redacted]d gestation.  They were counseled that the risk for aneuploidy decreases as gestational age increases, accounting for those pregnancies which spontaneously abort.  We specifically discussed Down syndrome (trisomy 81), trisomies 87 and 1, and sex chromosome aneuploidies (47,XXX and 47,XXY) including the common features and prognoses of each.   We reviewed available screening options including noninvasive prenatal testing (NIPT) and detailed ultrasound. They understand that screening tests are used to modify a patient's a priori risk for aneuploidy, typically based on age.  This estimate provides a pregnancy specific risk assessment.  Specifically, we discussed that NIPT analyzes cell free fetal DNA found in the maternal circulation. This test is not diagnostic for chromosome conditions, but can provide information regarding the presence or absence of extra fetal DNA for chromosomes 13, 18, 21, X, and Y, and missing fetal DNA for chromosome X and Y (Turner syndrome). Thus, it would not identify or rule out all genetic conditions. The reported detection rate is  greater than 99% for Trisomy 21, greater than 98% for Trisomy 18, and is approximately 80% (8 out of 10) for Trisomy 13. The false positive rate is reported to be less than 0.1% for any of these conditions.  In addition, we discussed that ~50-80% of fetuses with Down syndrome and up to 90-95% of fetuses with trisomy 18/13, when well visualized, have detectable anomalies or soft markers by detailed ultrasound (~18+ weeks gestation). Anne Curtis previously had ultrasound performed through [facility] on 06/04/12. We reviewed that the visualized fetal anatomy reportedly appeared normal.   They were also counseled regarding diagnostic testing via amniocentesis.  We reviewed the approximate 1 in 300-500 risk for complications for amniocentesis, including spontaneous preterm labor and delivery. We discussed the risks, limitations, and benefits of each screening and testing option. After consideration of all the options, they declined additional screening or testing for aneuploidy, including NIPT and amniocentesis. The patient stated that she was only interested in ultrasound during pregnancy. The couple understands that ultrasound cannot diagnose or rule out all birth defects or genetic conditions.   Anne Curtis was provided with written information regarding sickle cell anemia (SCA) including the carrier frequency and incidence in the African population, the availability of carrier testing and prenatal diagnosis if indicated.  In addition, we discussed that hemoglobinopathies are routinely screened for as part of the [place] newborn screening panel.  Hemoglobin electrophoresis was previously performed and was normal.  Both family histories were reviewed and found to be contributory for a previous fetal demise in the second trimester and a stillbirth at approximately 7 months gestation for the couple. Both of these pregnancies reportedly were found to have hydrocephalus  via prenatal  ultrasound, and both were reportedly female. No genetic testing was performed for these pregnancies. We do not currently have records confirming this report. Anne Curtis stated that she had multiple lab analyses and was found to be vitamin deficient, which she reported was the cause for the previous pregnancy losses. She reported that she took folic acid during the pregnancy with her daughter, which is what contributed to a healthy pregnancy.   Hydrocephalus can be isolated (nonsyndromic) or seen as one feature of an underlying chromosome or genetic condition. Hydrocephalus is typically isolated and multifactorial, involving a combination of genetic and environmental contributing factors. Hydrocephalus can also occur due to infection during pregnancy or due to sporadic occurrences, such as intracranial hemorrhage or CNS tumor. Rarely, nonsyndromic hydrocephalus can follow autosomal recessive or autosomal dominant inheritance. X-linked hydrocephalus is also observed in some families. We discussed that when isolated and when multifactorial inheritance is suspected, recurrence risk for full siblings is approximately 1-2% and approximately 8% in the case of two affected siblings. However, given the family history of two female full siblings with hydrocephalus, the possibility of an underlying genetic (syndromic or nonsyndromic) etiology cannot be ruled out. We briefly reviewed X-linked inheritance, where female carriers would have a 1 in 4 chance with each pregnancy to have an affected female. In autosomal recessive inheritance, a carrier couple has a 25% chance with each pregnancy to have a pregnancy with the autosomal recessive condition, a 50% chance to have a pregnancy who is a carrier, and a 25% chance to have a pregnancy neither affected nor a carrier. The couple understands that in the absence of an identified cause, recurrence risk for the current pregnancy cannot accurately be assessed. Additionally, in  the absence of an identified genetic etiology, prenatal testing via amniocentesis would not likely be informative. The patient previously declined invasive testing in the pregnancy for fetal aneuploidy. Targeted ultrasound is available to assess for features of hydrocephalus. However, ultrasound cannot diagnose or rule out all birth defects or genetic conditions and would not rule out the possibility of hydrocephalus developing at a later gestational age. We reviewed that the previous ultrasound performed on 06/04/12 reportedly visualized normal lateral ventricle measurements.   Regarding the history of pregnancy losses for the couple, previous thrombophilia work-up was performed in [place], as well as karyotype analysis for the patient and her partner. The patient and her partner were found to have normal karyotype analysis. Protein S deficiency and MTHFR heterozygosity was reportedly identified for the patient. Please see MFM consult note from today's visit for detailed discussion regarding this history. Without further information regarding the provided family history, an accurate genetic risk cannot be calculated. Further genetic counseling is warranted if more information is obtained.  Anne Curtis denied exposure to environmental toxins or chemical agents. She denied the use of alcohol, tobacco or street drugs. She denied significant viral illnesses during the course of her pregnancy. Her medical and surgical histories were contributory for a history of two first trimester SABs, one second trimester fetal demise, and one stillbirth. Previous labs reportedly indicated protein S deficiency. Anne Curtis was seen for MFM consultation at the time of today's visit to discuss this history and pregnancy management in detail. See separate MFM consult note.    I counseled this couple regarding the above risks and available options.  The approximate face-to-face time with the genetic counselor  was 35 minutes.  Quinn Plowman, MS,  Patent attorney  07/08/2012  2:06 PM

## 2012-07-13 ENCOUNTER — Other Ambulatory Visit (HOSPITAL_COMMUNITY): Payer: Self-pay | Admitting: Obstetrics and Gynecology

## 2012-07-13 ENCOUNTER — Encounter (HOSPITAL_COMMUNITY): Payer: Self-pay

## 2012-07-13 ENCOUNTER — Ambulatory Visit (HOSPITAL_COMMUNITY)
Admission: RE | Admit: 2012-07-13 | Discharge: 2012-07-13 | Disposition: A | Discharge status: Home / Self Care | Payer: Medicaid Other | Source: Ambulatory Visit | Attending: Obstetrics & Gynecology | Admitting: Obstetrics & Gynecology

## 2012-07-13 VITALS — BP 120/70 | HR 108 | Wt 195.0 lb

## 2012-07-13 DIAGNOSIS — Z8759 Personal history of other complications of pregnancy, childbirth and the puerperium: Secondary | ICD-10-CM

## 2012-07-13 DIAGNOSIS — O99119 Other diseases of the blood and blood-forming organs and certain disorders involving the immune mechanism complicating pregnancy, unspecified trimester: Secondary | ICD-10-CM

## 2012-07-13 DIAGNOSIS — Z8751 Personal history of pre-term labor: Secondary | ICD-10-CM | POA: Insufficient documentation

## 2012-07-13 DIAGNOSIS — O34219 Maternal care for unspecified type scar from previous cesarean delivery: Secondary | ICD-10-CM

## 2012-07-13 DIAGNOSIS — O0990 Supervision of high risk pregnancy, unspecified, unspecified trimester: Secondary | ICD-10-CM

## 2012-07-13 DIAGNOSIS — IMO0002 Reserved for concepts with insufficient information to code with codable children: Secondary | ICD-10-CM

## 2012-07-13 DIAGNOSIS — D6859 Other primary thrombophilia: Secondary | ICD-10-CM

## 2012-07-13 DIAGNOSIS — O09299 Supervision of pregnancy with other poor reproductive or obstetric history, unspecified trimester: Secondary | ICD-10-CM | POA: Insufficient documentation

## 2012-07-13 DIAGNOSIS — D689 Coagulation defect, unspecified: Secondary | ICD-10-CM

## 2012-07-15 ENCOUNTER — Ambulatory Visit (INDEPENDENT_AMBULATORY_CARE_PROVIDER_SITE_OTHER): Payer: Medicaid Other | Admitting: Family Medicine

## 2012-07-15 VITALS — BP 96/60 | Temp 97.0°F | Wt 195.9 lb

## 2012-07-15 DIAGNOSIS — O0990 Supervision of high risk pregnancy, unspecified, unspecified trimester: Secondary | ICD-10-CM

## 2012-07-15 DIAGNOSIS — O09299 Supervision of pregnancy with other poor reproductive or obstetric history, unspecified trimester: Secondary | ICD-10-CM

## 2012-07-15 DIAGNOSIS — Z8759 Personal history of other complications of pregnancy, childbirth and the puerperium: Secondary | ICD-10-CM

## 2012-07-15 MED ORDER — FERROUS SULFATE 325 (65 FE) MG PO TABS: 325.0000 mg | ORAL_TABLET | Freq: Two times a day (BID) | ORAL | Status: DC

## 2012-07-15 MED ORDER — ACETAMINOPHEN 325 MG PO TABS: 650.0000 mg | ORAL_TABLET | Freq: Four times a day (QID) | ORAL | Status: DC | PRN

## 2012-07-15 NOTE — Patient Instructions (Signed)
Pregnancy - Third Trimester  The third trimester of pregnancy (the last 3 months) is a period of the most rapid growth for you and your baby. The baby approaches a length of 20 inches and a weight of 6 to 10 pounds. The baby is adding on fat and getting ready for life outside your body. While inside, babies have periods of sleeping and waking, suck their thumbs, and hiccups. You can often feel small contractions of the uterus. This is false labor. It is also called Braxton-Hicks contractions. This is like a practice for labor. The usual problems in this stage of pregnancy include more difficulty breathing, swelling of the hands and feet from water retention, and having to urinate more often because of the uterus and baby pressing on your bladder.   PRENATAL EXAMS  · Blood work may continue to be done during prenatal exams. These tests are done to check on your health and the probable health of your baby. Blood work is used to follow your blood levels (hemoglobin). Anemia (low hemoglobin) is common during pregnancy. Iron and vitamins are given to help prevent this. You may also continue to be checked for diabetes. Some of the past blood tests may be done again.  · The size of the uterus is measured during each visit. This makes sure your baby is growing properly according to your pregnancy dates.  · Your blood pressure is checked every prenatal visit. This is to make sure you are not getting toxemia.  · Your urine is checked every prenatal visit for infection, diabetes and protein.  · Your weight is checked at each visit. This is done to make sure gains are happening at the suggested rate and that you and your baby are growing normally.  · Sometimes, an ultrasound is performed to confirm the position and the proper growth and development of the baby. This is a test done that bounces harmless sound waves off the baby so your caregiver can more accurately determine due dates.  · Discuss the type of pain medication and  anesthesia you will have during your labor and delivery.  · Discuss the possibility and anesthesia if a Cesarean Section might be necessary.  · Inform your caregiver if there is any mental or physical violence at home.  Sometimes, a specialized non-stress test, contraction stress test and biophysical profile are done to make sure the baby is not having a problem. Checking the amniotic fluid surrounding the baby is called an amniocentesis. The amniotic fluid is removed by sticking a needle into the belly (abdomen). This is sometimes done near the end of pregnancy if an early delivery is required. In this case, it is done to help make sure the baby's lungs are mature enough for the baby to live outside of the womb. If the lungs are not mature and it is unsafe to deliver the baby, an injection of cortisone medication is given to the mother 1 to 2 days before the delivery. This helps the baby's lungs mature and makes it safer to deliver the baby.  CHANGES OCCURING IN THE THIRD TRIMESTER OF PREGNANCY  Your body goes through many changes during pregnancy. They vary from person to person. Talk to your caregiver about changes you notice and are concerned about.  · During the last trimester, you have probably had an increase in your appetite. It is normal to have cravings for certain foods. This varies from person to person and pregnancy to pregnancy.  · You may begin to   get stretch marks on your hips, abdomen, and breasts. These are normal changes in the body during pregnancy. There are no exercises or medications to take which prevent this change.  · Constipation may be treated with a stool softener or adding bulk to your diet. Drinking lots of fluids, fiber in vegetables, fruits, and whole grains are helpful.  · Exercising is also helpful. If you have been very active up until your pregnancy, most of these activities can be continued during your pregnancy. If you have been less active, it is helpful to start an exercise  program such as walking. Consult your caregiver before starting exercise programs.  · Avoid all smoking, alcohol, un-prescribed drugs, herbs and "street drugs" during your pregnancy. These chemicals affect the formation and growth of the baby. Avoid chemicals throughout the pregnancy to ensure the delivery of a healthy infant.  · Backache, varicose veins and hemorrhoids may develop or get worse.  · You will tire more easily in the third trimester, which is normal.  · The baby's movements may be stronger and more often.  · You may become short of breath easily.  · Your belly button may stick out.  · A yellow discharge may leak from your breasts called colostrum.  · You may have a bloody mucus discharge. This usually occurs a few days to a week before labor begins.  HOME CARE INSTRUCTIONS   · Keep your caregiver's appointments. Follow your caregiver's instructions regarding medication use, exercise, and diet.  · During pregnancy, you are providing food for you and your baby. Continue to eat regular, well-balanced meals. Choose foods such as meat, fish, milk and other low fat dairy products, vegetables, fruits, and whole-grain breads and cereals. Your caregiver will tell you of the ideal weight gain.  · A physical sexual relationship may be continued throughout pregnancy if there are no other problems such as early (premature) leaking of amniotic fluid from the membranes, vaginal bleeding, or belly (abdominal) pain.  · Exercise regularly if there are no restrictions. Check with your caregiver if you are unsure of the safety of your exercises. Greater weight gain will occur in the last 2 trimesters of pregnancy. Exercising helps:  · Control your weight.  · Get you in shape for labor and delivery.  · You lose weight after you deliver.  · Rest a lot with legs elevated, or as needed for leg cramps or low back pain.  · Wear a good support or jogging bra for breast tenderness during pregnancy. This may help if worn during  sleep. Pads or tissues may be used in the bra if you are leaking colostrum.  · Do not use hot tubs, steam rooms, or saunas.  · Wear your seat belt when driving. This protects you and your baby if you are in an accident.  · Avoid raw meat, cat litter boxes and soil used by cats. These carry germs that can cause birth defects in the baby.  · It is easier to loose urine during pregnancy. Tightening up and strengthening the pelvic muscles will help with this problem. You can practice stopping your urination while you are going to the bathroom. These are the same muscles you need to strengthen. It is also the muscles you would use if you were trying to stop from passing gas. You can practice tightening these muscles up 10 times a set and repeating this about 3 times per day. Once you know what muscles to tighten up, do not perform these   exercises during urination. It is more likely to cause an infection by backing up the urine.  · Ask for help if you have financial, counseling or nutritional needs during pregnancy. Your caregiver will be able to offer counseling for these needs as well as refer you for other special needs.  · Make a list of emergency phone numbers and have them available.  · Plan on getting help from family or friends when you go home from the hospital.  · Make a trial run to the hospital.  · Take prenatal classes with the father to understand, practice and ask questions about the labor and delivery.  · Prepare the baby's room/nursery.  · Do not travel out of the city unless it is absolutely necessary and with the advice of your caregiver.  · Wear only low or no heal shoes to have better balance and prevent falling.  MEDICATIONS AND DRUG USE IN PREGNANCY  · Take prenatal vitamins as directed. The vitamin should contain 1 milligram of folic acid. Keep all vitamins out of reach of children. Only a couple vitamins or tablets containing iron may be fatal to a baby or young child when ingested.  · Avoid use  of all medications, including herbs, over-the-counter medications, not prescribed or suggested by your caregiver. Only take over-the-counter or prescription medicines for pain, discomfort, or fever as directed by your caregiver. Do not use aspirin, ibuprofen (Motrin®, Advil®, Nuprin®) or naproxen (Aleve®) unless OK'd by your caregiver.  · Let your caregiver also know about herbs you may be using.  · Alcohol is related to a number of birth defects. This includes fetal alcohol syndrome. All alcohol, in any form, should be avoided completely. Smoking will cause low birth rate and premature babies.  · Street/illegal drugs are very harmful to the baby. They are absolutely forbidden. A baby born to an addicted mother will be addicted at birth. The baby will go through the same withdrawal an adult does.  SEEK MEDICAL CARE IF:  You have any concerns or worries during your pregnancy. It is better to call with your questions if you feel they cannot wait, rather than worry about them.  DECISIONS ABOUT CIRCUMCISION  You may or may not know the sex of your baby. If you know your baby is a boy, it may be time to think about circumcision. Circumcision is the removal of the foreskin of the penis. This is the skin that covers the sensitive end of the penis. There is no proven medical need for this. Often this decision is made on what is popular at the time or based upon religious beliefs and social issues. You can discuss these issues with your caregiver or pediatrician.  SEEK IMMEDIATE MEDICAL CARE IF:   · An unexplained oral temperature above 102° F (38.9° C) develops, or as your caregiver suggests.  · You have leaking of fluid from the vagina (birth canal). If leaking membranes are suspected, take your temperature and tell your caregiver of this when you call.  · There is vaginal spotting, bleeding or passing clots. Tell your caregiver of the amount and how many pads are used.  · You develop a bad smelling vaginal discharge with  a change in the color from clear to white.  · You develop vomiting that lasts more than 24 hours.  · You develop chills or fever.  · You develop shortness of breath.  · You develop burning on urination.  · You loose more than 2 pounds of weight   or gain more than 2 pounds of weight or as suggested by your caregiver.  · You notice sudden swelling of your face, hands, and feet or legs.  · You develop belly (abdominal) pain. Round ligament discomfort is a common non-cancerous (benign) cause of abdominal pain in pregnancy. Your caregiver still must evaluate you.  · You develop a severe headache that does not go away.  · You develop visual problems, blurred or double vision.  · If you have not felt your baby move for more than 1 hour. If you think the baby is not moving as much as usual, eat something with sugar in it and lie down on your left side for an hour. The baby should move at least 4 to 5 times per hour. Call right away if your baby moves less than that.  · You fall, are in a car accident or any kind of trauma.  · There is mental or physical violence at home.  Document Released: 08/26/2001 Document Revised: 11/24/2011 Document Reviewed: 02/28/2009  [employer]® Patient Information ©2013 [employer].

## 2012-07-15 NOTE — Progress Notes (Signed)
Pain in pubic bone. No contractions, no loss of fluid. Creamy, occasionally watery vaginal discharge, no itching discomfort. No bleeding. Baby moving. Desires repeat c-section.

## 2012-07-15 NOTE — Progress Notes (Signed)
Pulse: 97

## 2012-07-29 ENCOUNTER — Ambulatory Visit (INDEPENDENT_AMBULATORY_CARE_PROVIDER_SITE_OTHER): Payer: Medicaid Other | Admitting: Family Medicine

## 2012-07-29 ENCOUNTER — Encounter: Payer: Self-pay | Admitting: Obstetrics & Gynecology

## 2012-07-29 ENCOUNTER — Encounter: Payer: Self-pay | Admitting: Obstetrics and Gynecology

## 2012-07-29 VITALS — BP 101/69 | Temp 97.3°F | Wt 198.0 lb

## 2012-07-29 DIAGNOSIS — IMO0002 Reserved for concepts with insufficient information to code with codable children: Secondary | ICD-10-CM

## 2012-07-29 DIAGNOSIS — O09529 Supervision of elderly multigravida, unspecified trimester: Secondary | ICD-10-CM

## 2012-07-29 DIAGNOSIS — Z8759 Personal history of other complications of pregnancy, childbirth and the puerperium: Secondary | ICD-10-CM

## 2012-07-29 DIAGNOSIS — D649 Anemia, unspecified: Secondary | ICD-10-CM | POA: Insufficient documentation

## 2012-07-29 DIAGNOSIS — D6859 Other primary thrombophilia: Secondary | ICD-10-CM

## 2012-07-29 DIAGNOSIS — O09299 Supervision of pregnancy with other poor reproductive or obstetric history, unspecified trimester: Secondary | ICD-10-CM

## 2012-07-29 LAB — POCT URINALYSIS DIP (DEVICE)
Bilirubin Urine: NEGATIVE
Ketones, ur: NEGATIVE mg/dL
Leukocytes, UA: NEGATIVE
Protein, ur: NEGATIVE mg/dL
Specific Gravity, Urine: 1.015 (ref 1.005–1.030)
pH: 5.5 (ref 5.0–8.0)

## 2012-07-29 NOTE — Progress Notes (Signed)
Pulse 104 Patient reports pain and pressure around pubic bone and occasional creamy d/c but no itching or odor

## 2012-07-29 NOTE — Progress Notes (Signed)
Desires repeat c-section, declines TOLAC. Feeling very thirsty last night. 1 hour GTT abnl, 3 hour had one abnl value only (70, 164, 168, 139). If continues to have polydipsia, will check random CBG. See nutritionist next visit. EFW 32% at 29 weeks. Hx IUFD due to hydrocephalus. Normal brain and lateral ventricles at 24 and 29 weeks. Hx protein S deficiency on lovenox & baby aspirin. Appropriate intrauterine growth to date, has f/u sono 11/26 for growth. Per MFM, consider antenatal testing. Anemia:  Hgb 7.6, Ferritin 3. Pt not taking iron, does not want to take a lot of pills. Explained risk of hemorrhage esp due to aspirin/lovenox and encouraged her to take iron.

## 2012-07-29 NOTE — Patient Instructions (Signed)
Pregnancy - Third Trimester  The third trimester of pregnancy (the last 3 months) is a period of the most rapid growth for you and your baby. The baby approaches a length of 20 inches and a weight of 6 to 10 pounds. The baby is adding on fat and getting ready for life outside your body. While inside, babies have periods of sleeping and waking, suck their thumbs, and hiccups. You can often feel small contractions of the uterus. This is false labor. It is also called Braxton-Hicks contractions. This is like a practice for labor. The usual problems in this stage of pregnancy include more difficulty breathing, swelling of the hands and feet from water retention, and having to urinate more often because of the uterus and baby pressing on your bladder.   PRENATAL EXAMS  · Blood work may continue to be done during prenatal exams. These tests are done to check on your health and the probable health of your baby. Blood work is used to follow your blood levels (hemoglobin). Anemia (low hemoglobin) is common during pregnancy. Iron and vitamins are given to help prevent this. You may also continue to be checked for diabetes. Some of the past blood tests may be done again.  · The size of the uterus is measured during each visit. This makes sure your baby is growing properly according to your pregnancy dates.  · Your blood pressure is checked every prenatal visit. This is to make sure you are not getting toxemia.  · Your urine is checked every prenatal visit for infection, diabetes and protein.  · Your weight is checked at each visit. This is done to make sure gains are happening at the suggested rate and that you and your baby are growing normally.  · Sometimes, an ultrasound is performed to confirm the position and the proper growth and development of the baby. This is a test done that bounces harmless sound waves off the baby so your caregiver can more accurately determine due dates.  · Discuss the type of pain medication and  anesthesia you will have during your labor and delivery.  · Discuss the possibility and anesthesia if a Cesarean Section might be necessary.  · Inform your caregiver if there is any mental or physical violence at home.  Sometimes, a specialized non-stress test, contraction stress test and biophysical profile are done to make sure the baby is not having a problem. Checking the amniotic fluid surrounding the baby is called an amniocentesis. The amniotic fluid is removed by sticking a needle into the belly (abdomen). This is sometimes done near the end of pregnancy if an early delivery is required. In this case, it is done to help make sure the baby's lungs are mature enough for the baby to live outside of the womb. If the lungs are not mature and it is unsafe to deliver the baby, an injection of cortisone medication is given to the mother 1 to 2 days before the delivery. This helps the baby's lungs mature and makes it safer to deliver the baby.  CHANGES OCCURING IN THE THIRD TRIMESTER OF PREGNANCY  Your body goes through many changes during pregnancy. They vary from person to person. Talk to your caregiver about changes you notice and are concerned about.  · During the last trimester, you have probably had an increase in your appetite. It is normal to have cravings for certain foods. This varies from person to person and pregnancy to pregnancy.  · You may begin to   get stretch marks on your hips, abdomen, and breasts. These are normal changes in the body during pregnancy. There are no exercises or medications to take which prevent this change.  · Constipation may be treated with a stool softener or adding bulk to your diet. Drinking lots of fluids, fiber in vegetables, fruits, and whole grains are helpful.  · Exercising is also helpful. If you have been very active up until your pregnancy, most of these activities can be continued during your pregnancy. If you have been less active, it is helpful to start an exercise  program such as walking. Consult your caregiver before starting exercise programs.  · Avoid all smoking, alcohol, un-prescribed drugs, herbs and "street drugs" during your pregnancy. These chemicals affect the formation and growth of the baby. Avoid chemicals throughout the pregnancy to ensure the delivery of a healthy infant.  · Backache, varicose veins and hemorrhoids may develop or get worse.  · You will tire more easily in the third trimester, which is normal.  · The baby's movements may be stronger and more often.  · You may become short of breath easily.  · Your belly button may stick out.  · A yellow discharge may leak from your breasts called colostrum.  · You may have a bloody mucus discharge. This usually occurs a few days to a week before labor begins.  HOME CARE INSTRUCTIONS   · Keep your caregiver's appointments. Follow your caregiver's instructions regarding medication use, exercise, and diet.  · During pregnancy, you are providing food for you and your baby. Continue to eat regular, well-balanced meals. Choose foods such as meat, fish, milk and other low fat dairy products, vegetables, fruits, and whole-grain breads and cereals. Your caregiver will tell you of the ideal weight gain.  · A physical sexual relationship may be continued throughout pregnancy if there are no other problems such as early (premature) leaking of amniotic fluid from the membranes, vaginal bleeding, or belly (abdominal) pain.  · Exercise regularly if there are no restrictions. Check with your caregiver if you are unsure of the safety of your exercises. Greater weight gain will occur in the last 2 trimesters of pregnancy. Exercising helps:  · Control your weight.  · Get you in shape for labor and delivery.  · You lose weight after you deliver.  · Rest a lot with legs elevated, or as needed for leg cramps or low back pain.  · Wear a good support or jogging bra for breast tenderness during pregnancy. This may help if worn during  sleep. Pads or tissues may be used in the bra if you are leaking colostrum.  · Do not use hot tubs, steam rooms, or saunas.  · Wear your seat belt when driving. This protects you and your baby if you are in an accident.  · Avoid raw meat, cat litter boxes and soil used by cats. These carry germs that can cause birth defects in the baby.  · It is easier to loose urine during pregnancy. Tightening up and strengthening the pelvic muscles will help with this problem. You can practice stopping your urination while you are going to the bathroom. These are the same muscles you need to strengthen. It is also the muscles you would use if you were trying to stop from passing gas. You can practice tightening these muscles up 10 times a set and repeating this about 3 times per day. Once you know what muscles to tighten up, do not perform these   exercises during urination. It is more likely to cause an infection by backing up the urine.  · Ask for help if you have financial, counseling or nutritional needs during pregnancy. Your caregiver will be able to offer counseling for these needs as well as refer you for other special needs.  · Make a list of emergency phone numbers and have them available.  · Plan on getting help from family or friends when you go home from the hospital.  · Make a trial run to the hospital.  · Take prenatal classes with the father to understand, practice and ask questions about the labor and delivery.  · Prepare the baby's room/nursery.  · Do not travel out of the city unless it is absolutely necessary and with the advice of your caregiver.  · Wear only low or no heal shoes to have better balance and prevent falling.  MEDICATIONS AND DRUG USE IN PREGNANCY  · Take prenatal vitamins as directed. The vitamin should contain 1 milligram of folic acid. Keep all vitamins out of reach of children. Only a couple vitamins or tablets containing iron may be fatal to a baby or young child when ingested.  · Avoid use  of all medications, including herbs, over-the-counter medications, not prescribed or suggested by your caregiver. Only take over-the-counter or prescription medicines for pain, discomfort, or fever as directed by your caregiver. Do not use aspirin, ibuprofen (Motrin®, Advil®, Nuprin®) or naproxen (Aleve®) unless OK'd by your caregiver.  · Let your caregiver also know about herbs you may be using.  · Alcohol is related to a number of birth defects. This includes fetal alcohol syndrome. All alcohol, in any form, should be avoided completely. Smoking will cause low birth rate and premature babies.  · Street/illegal drugs are very harmful to the baby. They are absolutely forbidden. A baby born to an addicted mother will be addicted at birth. The baby will go through the same withdrawal an adult does.  SEEK MEDICAL CARE IF:  You have any concerns or worries during your pregnancy. It is better to call with your questions if you feel they cannot wait, rather than worry about them.  DECISIONS ABOUT CIRCUMCISION  You may or may not know the sex of your baby. If you know your baby is a boy, it may be time to think about circumcision. Circumcision is the removal of the foreskin of the penis. This is the skin that covers the sensitive end of the penis. There is no proven medical need for this. Often this decision is made on what is popular at the time or based upon religious beliefs and social issues. You can discuss these issues with your caregiver or pediatrician.  SEEK IMMEDIATE MEDICAL CARE IF:   · An unexplained oral temperature above 102° F (38.9° C) develops, or as your caregiver suggests.  · You have leaking of fluid from the vagina (birth canal). If leaking membranes are suspected, take your temperature and tell your caregiver of this when you call.  · There is vaginal spotting, bleeding or passing clots. Tell your caregiver of the amount and how many pads are used.  · You develop a bad smelling vaginal discharge with  a change in the color from clear to white.  · You develop vomiting that lasts more than 24 hours.  · You develop chills or fever.  · You develop shortness of breath.  · You develop burning on urination.  · You loose more than 2 pounds of weight   or gain more than 2 pounds of weight or as suggested by your caregiver.  · You notice sudden swelling of your face, hands, and feet or legs.  · You develop belly (abdominal) pain. Round ligament discomfort is a common non-cancerous (benign) cause of abdominal pain in pregnancy. Your caregiver still must evaluate you.  · You develop a severe headache that does not go away.  · You develop visual problems, blurred or double vision.  · If you have not felt your baby move for more than 1 hour. If you think the baby is not moving as much as usual, eat something with sugar in it and lie down on your left side for an hour. The baby should move at least 4 to 5 times per hour. Call right away if your baby moves less than that.  · You fall, are in a car accident or any kind of trauma.  · There is mental or physical violence at home.  Document Released: 08/26/2001 Document Revised: 11/24/2011 Document Reviewed: 02/28/2009  [employer]® Patient Information ©2013 [employer].

## 2012-08-10 ENCOUNTER — Ambulatory Visit (HOSPITAL_COMMUNITY)
Admission: RE | Admit: 2012-08-10 | Discharge: 2012-08-10 | Disposition: A | Discharge status: Home / Self Care | Payer: Medicaid Other | Source: Ambulatory Visit | Attending: Obstetrics & Gynecology | Admitting: Obstetrics & Gynecology

## 2012-08-10 VITALS — BP 106/55 | HR 108 | Wt 199.0 lb

## 2012-08-10 DIAGNOSIS — O99891 Other specified diseases and conditions complicating pregnancy: Secondary | ICD-10-CM | POA: Insufficient documentation

## 2012-08-10 DIAGNOSIS — IMO0002 Reserved for concepts with insufficient information to code with codable children: Secondary | ICD-10-CM

## 2012-08-10 DIAGNOSIS — D689 Coagulation defect, unspecified: Secondary | ICD-10-CM

## 2012-08-10 DIAGNOSIS — D6859 Other primary thrombophilia: Secondary | ICD-10-CM | POA: Insufficient documentation

## 2012-08-10 DIAGNOSIS — Z8759 Personal history of other complications of pregnancy, childbirth and the puerperium: Secondary | ICD-10-CM

## 2012-08-10 DIAGNOSIS — O0990 Supervision of high risk pregnancy, unspecified, unspecified trimester: Secondary | ICD-10-CM

## 2012-08-10 DIAGNOSIS — O34219 Maternal care for unspecified type scar from previous cesarean delivery: Secondary | ICD-10-CM

## 2012-08-10 DIAGNOSIS — O99119 Other diseases of the blood and blood-forming organs and certain disorders involving the immune mechanism complicating pregnancy, unspecified trimester: Secondary | ICD-10-CM

## 2012-08-10 DIAGNOSIS — O09529 Supervision of elderly multigravida, unspecified trimester: Secondary | ICD-10-CM | POA: Insufficient documentation

## 2012-08-10 DIAGNOSIS — O09299 Supervision of pregnancy with other poor reproductive or obstetric history, unspecified trimester: Secondary | ICD-10-CM | POA: Insufficient documentation

## 2012-08-19 ENCOUNTER — Ambulatory Visit (INDEPENDENT_AMBULATORY_CARE_PROVIDER_SITE_OTHER): Payer: Medicaid Other | Admitting: Obstetrics & Gynecology

## 2012-08-19 VITALS — BP 107/66 | Temp 97.3°F | Wt 196.2 lb

## 2012-08-19 DIAGNOSIS — O09899 Supervision of other high risk pregnancies, unspecified trimester: Secondary | ICD-10-CM

## 2012-08-19 MED ORDER — DALTEPARIN SODIUM 7500 UNIT/0.3ML ~~LOC~~ SOLN: 1.0000 | Freq: Every day | SUBCUTANEOUS | Status: DC

## 2012-08-19 NOTE — Progress Notes (Signed)
Fragmin reordered.  Pt refuses GBS today, but will do next week.  C/S scheduled for January 2nd (39 weeks 1 day).  Requested a female. No problems.  Pt has "no desire" to urinate today.  Pt will be prepared to give urine sample next week.  BP nml.  Pt also refusing GBS today but will do next week.

## 2012-08-19 NOTE — Progress Notes (Signed)
P = 99 Pain/pressure in back and lower abdomen

## 2012-08-26 ENCOUNTER — Ambulatory Visit (INDEPENDENT_AMBULATORY_CARE_PROVIDER_SITE_OTHER): Payer: Medicaid Other | Admitting: Family

## 2012-08-26 VITALS — BP 110/67 | Temp 96.8°F | Wt 193.8 lb

## 2012-08-26 DIAGNOSIS — D6859 Other primary thrombophilia: Secondary | ICD-10-CM

## 2012-08-26 DIAGNOSIS — O99119 Other diseases of the blood and blood-forming organs and certain disorders involving the immune mechanism complicating pregnancy, unspecified trimester: Secondary | ICD-10-CM

## 2012-08-26 DIAGNOSIS — O0990 Supervision of high risk pregnancy, unspecified, unspecified trimester: Secondary | ICD-10-CM

## 2012-08-26 LAB — POCT URINALYSIS DIP (DEVICE)
Bilirubin Urine: NEGATIVE
Leukocytes, UA: NEGATIVE
Protein, ur: NEGATIVE mg/dL
Specific Gravity, Urine: 1.02 (ref 1.005–1.030)
pH: 5.5 (ref 5.0–8.0)

## 2012-08-26 NOTE — Addendum Note (Signed)
Addended by: Franchot Mimes on: 08/26/2012 11:27 AM   Modules accepted: Orders

## 2012-08-26 NOTE — Progress Notes (Signed)
Pt denies contractions or leaking of fluid; reluctant to do GBS, concerned about pain; GBS completed, pt relieved; declines GC/CT.  Urine dip results not available at discharge.  +fetal movement.  Consulted with Dr. Macon Large regarding HX of IUFD, since pt was know hydrocephalus x 2 and pt treated for protein S deficiency, fetal testing not required.

## 2012-08-26 NOTE — Progress Notes (Signed)
p-100 

## 2012-08-30 ENCOUNTER — Encounter: Payer: Self-pay | Admitting: Family

## 2012-09-02 ENCOUNTER — Ambulatory Visit (INDEPENDENT_AMBULATORY_CARE_PROVIDER_SITE_OTHER): Payer: Medicaid Other | Admitting: Obstetrics and Gynecology

## 2012-09-02 ENCOUNTER — Encounter: Payer: Self-pay | Admitting: Obstetrics and Gynecology

## 2012-09-02 VITALS — BP 123/71 | Temp 96.3°F | Wt 193.9 lb

## 2012-09-02 DIAGNOSIS — Z8759 Personal history of other complications of pregnancy, childbirth and the puerperium: Secondary | ICD-10-CM

## 2012-09-02 DIAGNOSIS — D649 Anemia, unspecified: Secondary | ICD-10-CM

## 2012-09-02 DIAGNOSIS — O0990 Supervision of high risk pregnancy, unspecified, unspecified trimester: Secondary | ICD-10-CM

## 2012-09-02 DIAGNOSIS — O09299 Supervision of pregnancy with other poor reproductive or obstetric history, unspecified trimester: Secondary | ICD-10-CM

## 2012-09-02 DIAGNOSIS — D6859 Other primary thrombophilia: Secondary | ICD-10-CM

## 2012-09-02 DIAGNOSIS — O09529 Supervision of elderly multigravida, unspecified trimester: Secondary | ICD-10-CM

## 2012-09-02 DIAGNOSIS — O99119 Other diseases of the blood and blood-forming organs and certain disorders involving the immune mechanism complicating pregnancy, unspecified trimester: Secondary | ICD-10-CM

## 2012-09-02 DIAGNOSIS — O34219 Maternal care for unspecified type scar from previous cesarean delivery: Secondary | ICD-10-CM

## 2012-09-02 DIAGNOSIS — IMO0002 Reserved for concepts with insufficient information to code with codable children: Secondary | ICD-10-CM

## 2012-09-02 DIAGNOSIS — O99891 Other specified diseases and conditions complicating pregnancy: Secondary | ICD-10-CM

## 2012-09-02 LAB — POCT URINALYSIS DIP (DEVICE)
Glucose, UA: NEGATIVE mg/dL
Hgb urine dipstick: NEGATIVE
Ketones, ur: NEGATIVE mg/dL
Protein, ur: NEGATIVE mg/dL
Specific Gravity, Urine: 1.02 (ref 1.005–1.030)
Urobilinogen, UA: 2 mg/dL — ABNORMAL HIGH (ref 0.0–1.0)

## 2012-09-02 NOTE — Progress Notes (Signed)
Patient doing well without complaints. FM/labor precautions reviewed 

## 2012-09-02 NOTE — Progress Notes (Signed)
Pulse- 116  Pain/pressure- lower abd

## 2012-09-09 ENCOUNTER — Encounter (HOSPITAL_COMMUNITY): Payer: Self-pay

## 2012-09-09 ENCOUNTER — Ambulatory Visit (INDEPENDENT_AMBULATORY_CARE_PROVIDER_SITE_OTHER): Payer: Medicaid Other | Admitting: Obstetrics & Gynecology

## 2012-09-09 ENCOUNTER — Encounter (HOSPITAL_COMMUNITY)
Admission: RE | Admit: 2012-09-09 | Discharge: 2012-09-09 | Disposition: A | Discharge status: Home / Self Care | Payer: 59 | Source: Ambulatory Visit | Attending: Obstetrics & Gynecology | Admitting: Obstetrics & Gynecology

## 2012-09-09 VITALS — BP 100/60 | Wt 193.7 lb

## 2012-09-09 VITALS — BP 105/72 | HR 94 | Resp 18 | Ht 64.75 in | Wt 194.0 lb

## 2012-09-09 DIAGNOSIS — O0990 Supervision of high risk pregnancy, unspecified, unspecified trimester: Secondary | ICD-10-CM

## 2012-09-09 DIAGNOSIS — O34219 Maternal care for unspecified type scar from previous cesarean delivery: Secondary | ICD-10-CM

## 2012-09-09 HISTORY — DX: Peripheral vascular disease, unspecified: I73.9

## 2012-09-09 LAB — POCT URINALYSIS DIP (DEVICE)
Bilirubin Urine: NEGATIVE
Glucose, UA: NEGATIVE mg/dL
Hgb urine dipstick: NEGATIVE
Specific Gravity, Urine: 1.015 (ref 1.005–1.030)
pH: 5.5 (ref 5.0–8.0)

## 2012-09-09 LAB — TYPE AND SCREEN
ABO/RH(D): A POS
Antibody Screen: NEGATIVE

## 2012-09-09 LAB — SURGICAL PCR SCREEN
MRSA, PCR: NEGATIVE
Staphylococcus aureus: NEGATIVE

## 2012-09-09 LAB — CBC
MCHC: 27.3 g/dL — ABNORMAL LOW (ref 30.0–36.0)
Platelets: 273 10*3/uL (ref 150–400)
RDW: 20.8 % — ABNORMAL HIGH (ref 11.5–15.5)
WBC: 16 10*3/uL — ABNORMAL HIGH (ref 4.0–10.5)

## 2012-09-09 LAB — PROTIME-INR
INR: 1.09 (ref 0.00–1.49)
Prothrombin Time: 14 seconds (ref 11.6–15.2)

## 2012-09-09 LAB — APTT: aPTT: 29 seconds (ref 24–37)

## 2012-09-09 NOTE — Patient Instructions (Addendum)
   Your procedure is scheduled on: Jan 2nd - Thursday  Enter through the Main Entrance of [facility] at: 1:15 pm Pick up the phone at the desk and dial [phone] and inform us of your arrival.  Please call this number if you have any problems the morning of surgery: [phone]  Remember: Do not eat food after midnight: Wednesday Do not drink clear liquids after: 10:30 am Thursday Take these medicines the morning of surgery with a SIP OF WATER:  None  Do not wear jewelry, make-up, or FINGER nail polish No metal in your hair or on your body. Do not wear lotions, powders, perfumes. You may wear deodorant.  Please use your CHG wash as directed prior to surgery.  Do not shave anywhere for at least 12 hours prior to first CHG shower.  Do not bring valuables to the hospital. Contacts, dentures or bridgework may not be worn into surgery.  Leave suitcase in the car. After Surgery it may be brought to your room. For patients being admitted to the hospital, checkout time is 11:00am the day of discharge.  Home with husband Muamer.

## 2012-09-09 NOTE — Pre-Procedure Instructions (Signed)
Patient speaks english but had arabic translator with her from [employer].   Clemens Catholic was the arabic interpreter.  Rufina Falco saw this patient at PAT appt.

## 2012-09-09 NOTE — Patient Instructions (Signed)
Contraception Choices  Contraception (birth control) is the use of any methods or devices to prevent pregnancy. Below are some methods to help avoid pregnancy.  HORMONAL METHODS   · Contraceptive implant. This is a thin, plastic tube containing progesterone hormone. It does not contain estrogen hormone. Your caregiver inserts the tube in the inner part of the upper arm. The tube can remain in place for up to 3 years. After 3 years, the implant must be removed. The implant prevents the ovaries from releasing an egg (ovulation), thickens the cervical mucus which prevents sperm from entering the uterus, and thins the lining of the inside of the uterus.  · Progesterone-only injections. These injections are given every 3 months by your caregiver to prevent pregnancy. This synthetic progesterone hormone stops the ovaries from releasing eggs. It also thickens cervical mucus and changes the uterine lining. This makes it harder for sperm to survive in the uterus.  · Birth control pills. These pills contain estrogen and progesterone hormone. They work by stopping the egg from forming in the ovary (ovulation). Birth control pills are prescribed by a caregiver. Birth control pills can also be used to treat heavy periods.  · Minipill. This type of birth control pill contains only the progesterone hormone. They are taken every day of each month and must be prescribed by your caregiver.  · Birth control patch. The patch contains hormones similar to those in birth control pills. It must be changed once a week and is prescribed by a caregiver.  · Vaginal ring. The ring contains hormones similar to those in birth control pills. It is left in the vagina for 3 weeks, removed for 1 week, and then a new one is put back in place. The patient must be comfortable inserting and removing the ring from the vagina. A caregiver's prescription is necessary.  · Emergency contraception. Emergency contraceptives prevent pregnancy after unprotected  sexual intercourse. This pill can be taken right after sex or up to 5 days after unprotected sex. It is most effective the sooner you take the pills after having sexual intercourse. Emergency contraceptive pills are available without a prescription. Check with your pharmacist. Do not use emergency contraception as your only form of birth control.  BARRIER METHODS   · Female condom. This is a thin sheath (latex or rubber) that is worn over the penis during sexual intercourse. It can be used with spermicide to increase effectiveness.  · Female condom. This is a soft, loose-fitting sheath that is put into the vagina before sexual intercourse.  · Diaphragm. This is a soft, latex, dome-shaped barrier that must be fitted by a caregiver. It is inserted into the vagina, along with a spermicidal jelly. It is inserted before intercourse. The diaphragm should be left in the vagina for 6 to 8 hours after intercourse.  · Cervical cap. This is a round, soft, latex or plastic cup that fits over the cervix and must be fitted by a caregiver. The cap can be left in place for up to 48 hours after intercourse.  · Sponge. This is a soft, circular piece of polyurethane foam. The sponge has spermicide in it. It is inserted into the vagina after wetting it and before sexual intercourse.  · Spermicides. These are chemicals that kill or block sperm from entering the cervix and uterus. They come in the form of creams, jellies, suppositories, foam, or tablets. They do not require a prescription. They are inserted into the vagina with an applicator before having sexual intercourse.   The process must be repeated every time you have sexual intercourse.  INTRAUTERINE CONTRACEPTION  · Intrauterine device (IUD). This is a T-shaped device that is put in a woman's uterus during a menstrual period to prevent pregnancy. There are 2 types:  · Copper IUD. This type of IUD is wrapped in copper wire and is placed inside the uterus. Copper makes the uterus and  fallopian tubes produce a fluid that kills sperm. It can stay in place for 10 years.  · Hormone IUD. This type of IUD contains the hormone progestin (synthetic progesterone). The hormone thickens the cervical mucus and prevents sperm from entering the uterus, and it also thins the uterine lining to prevent implantation of a fertilized egg. The hormone can weaken or kill the sperm that get into the uterus. It can stay in place for 5 years.  PERMANENT METHODS OF CONTRACEPTION  · Female tubal ligation. This is when the woman's fallopian tubes are surgically sealed, tied, or blocked to prevent the egg from traveling to the uterus.  · Female sterilization. This is when the female has the tubes that carry sperm tied off (vasectomy). This blocks sperm from entering the vagina during sexual intercourse. After the procedure, the man can still ejaculate fluid (semen).  NATURAL PLANNING METHODS  · Natural family planning. This is not having sexual intercourse or using a barrier method (condom, diaphragm, cervical cap) on days the woman could become pregnant.  · Calendar method. This is keeping track of the length of each menstrual cycle and identifying when you are fertile.  · Ovulation method. This is avoiding sexual intercourse during ovulation.  · Symptothermal method. This is avoiding sexual intercourse during ovulation, using a thermometer and ovulation symptoms.  · Post-ovulation method. This is timing sexual intercourse after you have ovulated.  Regardless of which type or method of contraception you choose, it is important that you use condoms to protect against the transmission of sexually transmitted diseases (STDs). Talk with your caregiver about which form of contraception is most appropriate for you.  Document Released: 09/01/2005 Document Revised: 11/24/2011 Document Reviewed: 01/08/2011  Exit[employer]® Patient Information ©2013 [employer].

## 2012-09-09 NOTE — Pre-Procedure Instructions (Signed)
Dr Arby Barrette informed that Anne Curtis from Lab at [facility] called to let us know patient's hgb 6.7 low.  Orders given T & S stat DOS and T & C 2 units stat DOS.  Patient instructed to arrive at [facility] at 12 noon per Dr Arby Barrette.  Patient informed.

## 2012-09-09 NOTE — Progress Notes (Signed)
No problems.  Ready for c/s.  Pt to stop aspirin today.  Patient will stop fragmin 24 hours prior to c./s next Thursday.  3rd trimester [place] one month ago shows nml anatomy.  Needs birth control plan.  Pre op appt 3pm today.  Patient should have 2 units crossed for c/s

## 2012-09-16 ENCOUNTER — Inpatient Hospital Stay (HOSPITAL_COMMUNITY)
Admission: RE | Admit: 2012-09-16 | Discharge: 2012-09-19 | DRG: 765 | Disposition: A | Discharge status: Home / Self Care | Payer: 59 | Source: Ambulatory Visit | Attending: Obstetrics & Gynecology | Admitting: Obstetrics & Gynecology

## 2012-09-16 ENCOUNTER — Encounter (HOSPITAL_COMMUNITY): Payer: Self-pay | Admitting: Anesthesiology

## 2012-09-16 ENCOUNTER — Inpatient Hospital Stay (HOSPITAL_COMMUNITY): Payer: 59 | Admitting: Anesthesiology

## 2012-09-16 ENCOUNTER — Encounter (HOSPITAL_COMMUNITY)
Admission: RE | Disposition: A | Discharge status: Home / Self Care | Payer: Self-pay | Source: Ambulatory Visit | Attending: Obstetrics & Gynecology

## 2012-09-16 ENCOUNTER — Encounter (HOSPITAL_COMMUNITY): Payer: Self-pay | Admitting: *Deleted

## 2012-09-16 DIAGNOSIS — O0990 Supervision of high risk pregnancy, unspecified, unspecified trimester: Secondary | ICD-10-CM

## 2012-09-16 DIAGNOSIS — O9912 Other diseases of the blood and blood-forming organs and certain disorders involving the immune mechanism complicating childbirth: Secondary | ICD-10-CM | POA: Diagnosis present

## 2012-09-16 DIAGNOSIS — D689 Coagulation defect, unspecified: Secondary | ICD-10-CM | POA: Diagnosis present

## 2012-09-16 DIAGNOSIS — O9903 Anemia complicating the puerperium: Secondary | ICD-10-CM | POA: Diagnosis not present

## 2012-09-16 DIAGNOSIS — O34219 Maternal care for unspecified type scar from previous cesarean delivery: Secondary | ICD-10-CM

## 2012-09-16 DIAGNOSIS — O09529 Supervision of elderly multigravida, unspecified trimester: Secondary | ICD-10-CM | POA: Diagnosis present

## 2012-09-16 DIAGNOSIS — D6859 Other primary thrombophilia: Secondary | ICD-10-CM | POA: Diagnosis present

## 2012-09-16 DIAGNOSIS — D649 Anemia, unspecified: Secondary | ICD-10-CM | POA: Diagnosis not present

## 2012-09-16 DIAGNOSIS — Z01812 Encounter for preprocedural laboratory examination: Secondary | ICD-10-CM

## 2012-09-16 DIAGNOSIS — Z01818 Encounter for other preprocedural examination: Secondary | ICD-10-CM

## 2012-09-16 LAB — CBC
HCT: 25 % — ABNORMAL LOW (ref 36.0–46.0)
Hemoglobin: 6.8 g/dL — CL (ref 12.0–15.0)
MCHC: 27.2 g/dL — ABNORMAL LOW (ref 30.0–36.0)

## 2012-09-16 SURGERY — Surgical Case
Anesthesia: Spinal | Site: Abdomen | Wound class: Clean Contaminated

## 2012-09-16 MED ORDER — PHENYLEPHRINE 40 MCG/ML (10ML) SYRINGE FOR IV PUSH (FOR BLOOD PRESSURE SUPPORT)
PREFILLED_SYRINGE | INTRAVENOUS | Status: AC
  Filled 2012-09-16: qty 20

## 2012-09-16 MED ORDER — PRENATAL MULTIVITAMIN CH
1.0000 | ORAL_TABLET | Freq: Every day | ORAL | Status: DC
  Administered 2012-09-17 – 2012-09-18 (×2): 1 via ORAL
  Filled 2012-09-16 (×2): qty 1

## 2012-09-16 MED ORDER — KETOROLAC TROMETHAMINE 30 MG/ML IJ SOLN: 30.0000 mg | Freq: Four times a day (QID) | INTRAMUSCULAR | Status: AC | PRN

## 2012-09-16 MED ORDER — DIBUCAINE 1 % RE OINT: 1.0000 "application " | TOPICAL_OINTMENT | RECTAL | Status: DC | PRN

## 2012-09-16 MED ORDER — ENOXAPARIN SODIUM 40 MG/0.4ML ~~LOC~~ SOLN
40.0000 mg | SUBCUTANEOUS | Status: DC
  Administered 2012-09-17 – 2012-09-18 (×2): 40 mg via SUBCUTANEOUS
  Filled 2012-09-16 (×3): qty 0.4

## 2012-09-16 MED ORDER — LANOLIN HYDROUS EX OINT: 1.0000 "application " | TOPICAL_OINTMENT | CUTANEOUS | Status: DC | PRN

## 2012-09-16 MED ORDER — SODIUM CHLORIDE 0.9 % IJ SOLN: 3.0000 mL | INTRAMUSCULAR | Status: DC | PRN

## 2012-09-16 MED ORDER — LACTATED RINGERS IV SOLN: INTRAVENOUS | Status: DC

## 2012-09-16 MED ORDER — IBUPROFEN 600 MG PO TABS
600.0000 mg | ORAL_TABLET | Freq: Four times a day (QID) | ORAL | Status: DC
  Administered 2012-09-17 – 2012-09-18 (×7): 600 mg via ORAL
  Filled 2012-09-16 (×5): qty 1

## 2012-09-16 MED ORDER — SIMETHICONE 80 MG PO CHEW: 80.0000 mg | CHEWABLE_TABLET | ORAL | Status: DC | PRN

## 2012-09-16 MED ORDER — BUPIVACAINE HCL (PF) 0.5 % IJ SOLN
INTRAMUSCULAR | Status: AC
  Filled 2012-09-16: qty 30

## 2012-09-16 MED ORDER — NALOXONE HCL 0.4 MG/ML IJ SOLN: 0.4000 mg | INTRAMUSCULAR | Status: DC | PRN

## 2012-09-16 MED ORDER — MORPHINE SULFATE 0.5 MG/ML IJ SOLN
INTRAMUSCULAR | Status: AC
  Filled 2012-09-16: qty 10

## 2012-09-16 MED ORDER — HYDROMORPHONE HCL PF 1 MG/ML IJ SOLN: 0.2500 mg | INTRAMUSCULAR | Status: DC | PRN

## 2012-09-16 MED ORDER — WITCH HAZEL-GLYCERIN EX PADS: 1.0000 "application " | MEDICATED_PAD | CUTANEOUS | Status: DC | PRN

## 2012-09-16 MED ORDER — MENTHOL 3 MG MT LOZG: 1.0000 | LOZENGE | OROMUCOSAL | Status: DC | PRN

## 2012-09-16 MED ORDER — METOCLOPRAMIDE HCL 5 MG/ML IJ SOLN: 10.0000 mg | Freq: Three times a day (TID) | INTRAMUSCULAR | Status: DC | PRN

## 2012-09-16 MED ORDER — OXYCODONE-ACETAMINOPHEN 5-325 MG PO TABS
1.0000 | ORAL_TABLET | ORAL | Status: DC | PRN
  Administered 2012-09-17: 1 via ORAL
  Administered 2012-09-17: 2 via ORAL
  Administered 2012-09-19 (×2): 1 via ORAL
  Filled 2012-09-16 (×2): qty 1
  Filled 2012-09-16: qty 2
  Filled 2012-09-16: qty 1
  Filled 2012-09-16: qty 2

## 2012-09-16 MED ORDER — FENTANYL CITRATE 0.05 MG/ML IJ SOLN
INTRAMUSCULAR | Status: DC | PRN
  Administered 2012-09-16: 25 ug via INTRATHECAL

## 2012-09-16 MED ORDER — DIPHENHYDRAMINE HCL 25 MG PO CAPS: 25.0000 mg | ORAL_CAPSULE | Freq: Four times a day (QID) | ORAL | Status: DC | PRN

## 2012-09-16 MED ORDER — LACTATED RINGERS IV SOLN
INTRAVENOUS | Status: DC | PRN
  Administered 2012-09-16: 15:00:00 via INTRAVENOUS

## 2012-09-16 MED ORDER — CEFAZOLIN SODIUM-DEXTROSE 2-3 GM-% IV SOLR
INTRAVENOUS | Status: DC | PRN
  Administered 2012-09-16: 2 g via INTRAVENOUS

## 2012-09-16 MED ORDER — LACTATED RINGERS IV SOLN
INTRAVENOUS | Status: DC
  Administered 2012-09-16: 13:00:00 via INTRAVENOUS

## 2012-09-16 MED ORDER — CEFAZOLIN SODIUM-DEXTROSE 2-3 GM-% IV SOLR: 2.0000 g | INTRAVENOUS | Status: DC

## 2012-09-16 MED ORDER — SIMETHICONE 80 MG PO CHEW
80.0000 mg | CHEWABLE_TABLET | Freq: Three times a day (TID) | ORAL | Status: DC
  Administered 2012-09-16 – 2012-09-19 (×10): 80 mg via ORAL

## 2012-09-16 MED ORDER — DIPHENHYDRAMINE HCL 25 MG PO CAPS: 25.0000 mg | ORAL_CAPSULE | ORAL | Status: DC | PRN

## 2012-09-16 MED ORDER — FERROUS SULFATE 325 (65 FE) MG PO TABS
325.0000 mg | ORAL_TABLET | Freq: Two times a day (BID) | ORAL | Status: DC
  Administered 2012-09-17 – 2012-09-19 (×5): 325 mg via ORAL
  Filled 2012-09-16 (×5): qty 1

## 2012-09-16 MED ORDER — ONDANSETRON HCL 4 MG PO TABS: 4.0000 mg | ORAL_TABLET | ORAL | Status: DC | PRN

## 2012-09-16 MED ORDER — ZOLPIDEM TARTRATE 5 MG PO TABS: 5.0000 mg | ORAL_TABLET | Freq: Every evening | ORAL | Status: DC | PRN

## 2012-09-16 MED ORDER — KETOROLAC TROMETHAMINE 60 MG/2ML IM SOLN: 60.0000 mg | Freq: Once | INTRAMUSCULAR | Status: AC | PRN

## 2012-09-16 MED ORDER — SCOPOLAMINE 1 MG/3DAYS TD PT72: 1.0000 | MEDICATED_PATCH | Freq: Once | TRANSDERMAL | Status: DC

## 2012-09-16 MED ORDER — OXYTOCIN 10 UNIT/ML IJ SOLN
40.0000 [IU] | INTRAMUSCULAR | Status: DC | PRN
  Administered 2012-09-16: 40 [IU] via INTRAVENOUS

## 2012-09-16 MED ORDER — SCOPOLAMINE 1 MG/3DAYS TD PT72
MEDICATED_PATCH | TRANSDERMAL | Status: AC
  Administered 2012-09-16: 1.5 mg via TRANSDERMAL
  Filled 2012-09-16: qty 1

## 2012-09-16 MED ORDER — MEPERIDINE HCL 25 MG/ML IJ SOLN: 6.2500 mg | INTRAMUSCULAR | Status: DC | PRN

## 2012-09-16 MED ORDER — NALBUPHINE HCL 10 MG/ML IJ SOLN: 5.0000 mg | INTRAMUSCULAR | Status: DC | PRN

## 2012-09-16 MED ORDER — ONDANSETRON HCL 4 MG/2ML IJ SOLN: 4.0000 mg | INTRAMUSCULAR | Status: DC | PRN

## 2012-09-16 MED ORDER — NALOXONE HCL 1 MG/ML IJ SOLN: 1.0000 ug/kg/h | INTRAVENOUS | Status: DC | PRN

## 2012-09-16 MED ORDER — CEFAZOLIN SODIUM-DEXTROSE 2-3 GM-% IV SOLR
INTRAVENOUS | Status: AC
  Filled 2012-09-16: qty 50

## 2012-09-16 MED ORDER — EPHEDRINE SULFATE 50 MG/ML IJ SOLN
INTRAMUSCULAR | Status: DC | PRN
  Administered 2012-09-16: 10 mg via INTRAVENOUS

## 2012-09-16 MED ORDER — OXYTOCIN 40 UNITS IN LACTATED RINGERS INFUSION - SIMPLE MED: 62.5000 mL/h | INTRAVENOUS | Status: AC

## 2012-09-16 MED ORDER — DIPHENHYDRAMINE HCL 50 MG/ML IJ SOLN: 25.0000 mg | INTRAMUSCULAR | Status: DC | PRN

## 2012-09-16 MED ORDER — EPHEDRINE 5 MG/ML INJ
INTRAVENOUS | Status: AC
  Filled 2012-09-16: qty 10

## 2012-09-16 MED ORDER — LACTATED RINGERS IV SOLN
INTRAVENOUS | Status: DC
  Administered 2012-09-17: 07:00:00 via INTRAVENOUS

## 2012-09-16 MED ORDER — KETOROLAC TROMETHAMINE 60 MG/2ML IM SOLN
INTRAMUSCULAR | Status: AC
  Administered 2012-09-16: 60 mg via INTRAMUSCULAR
  Filled 2012-09-16: qty 2

## 2012-09-16 MED ORDER — IBUPROFEN 600 MG PO TABS
600.0000 mg | ORAL_TABLET | Freq: Four times a day (QID) | ORAL | Status: DC | PRN
  Filled 2012-09-16 (×2): qty 1

## 2012-09-16 MED ORDER — TETANUS-DIPHTH-ACELL PERTUSSIS 5-2.5-18.5 LF-MCG/0.5 IM SUSP: 0.5000 mL | Freq: Once | INTRAMUSCULAR | Status: DC

## 2012-09-16 MED ORDER — ONDANSETRON HCL 4 MG/2ML IJ SOLN
INTRAMUSCULAR | Status: DC | PRN
  Administered 2012-09-16: 4 mg via INTRAVENOUS

## 2012-09-16 MED ORDER — LACTATED RINGERS IV SOLN
INTRAVENOUS | Status: DC | PRN
  Administered 2012-09-16 (×2): via INTRAVENOUS

## 2012-09-16 MED ORDER — ONDANSETRON HCL 4 MG/2ML IJ SOLN: 4.0000 mg | Freq: Three times a day (TID) | INTRAMUSCULAR | Status: DC | PRN

## 2012-09-16 MED ORDER — HEPARIN SODIUM (PORCINE) 5000 UNIT/ML IJ SOLN
5000.0000 [IU] | Freq: Three times a day (TID) | INTRAMUSCULAR | Status: AC
  Administered 2012-09-17 (×2): 5000 [IU] via SUBCUTANEOUS
  Filled 2012-09-16 (×2): qty 1

## 2012-09-16 MED ORDER — PHENYLEPHRINE HCL 10 MG/ML IJ SOLN
INTRAMUSCULAR | Status: DC | PRN
  Administered 2012-09-16 (×4): 80 ug via INTRAVENOUS
  Administered 2012-09-16: 120 ug via INTRAVENOUS
  Administered 2012-09-16: 40 ug via INTRAVENOUS
  Administered 2012-09-16: 80 ug via INTRAVENOUS
  Administered 2012-09-16: 120 ug via INTRAVENOUS
  Administered 2012-09-16: 40 ug via INTRAVENOUS
  Administered 2012-09-16 (×2): 80 ug via INTRAVENOUS
  Administered 2012-09-16: 120 ug via INTRAVENOUS

## 2012-09-16 MED ORDER — MORPHINE SULFATE (PF) 0.5 MG/ML IJ SOLN
INTRAMUSCULAR | Status: DC | PRN
  Administered 2012-09-16: .15 mg via INTRATHECAL

## 2012-09-16 MED ORDER — DIPHENHYDRAMINE HCL 50 MG/ML IJ SOLN
12.5000 mg | INTRAMUSCULAR | Status: DC | PRN
  Administered 2012-09-17: 12.5 mg via INTRAVENOUS
  Filled 2012-09-16: qty 1

## 2012-09-16 MED ORDER — SENNOSIDES-DOCUSATE SODIUM 8.6-50 MG PO TABS
2.0000 | ORAL_TABLET | Freq: Every day | ORAL | Status: DC
  Administered 2012-09-16 – 2012-09-18 (×3): 2 via ORAL

## 2012-09-16 MED ORDER — FENTANYL CITRATE 0.05 MG/ML IJ SOLN
INTRAMUSCULAR | Status: AC
  Filled 2012-09-16: qty 2

## 2012-09-16 MED ORDER — SCOPOLAMINE 1 MG/3DAYS TD PT72
1.0000 | MEDICATED_PATCH | Freq: Once | TRANSDERMAL | Status: DC
  Administered 2012-09-16: 1.5 mg via TRANSDERMAL

## 2012-09-16 SURGICAL SUPPLY — 41 items
BENZOIN TINCTURE PRP APPL 2/3 (GAUZE/BANDAGES/DRESSINGS) ×2 IMPLANT
BINDER ABD UNIV 10 28-50 (GAUZE/BANDAGES/DRESSINGS) IMPLANT
BINDER ABD UNIV 12 45-62 (WOUND CARE) IMPLANT
BINDER ABDOM UNIV 10 (GAUZE/BANDAGES/DRESSINGS)
BINDER ABDOMINAL 46IN 62IN (WOUND CARE)
CLOTH BEACON ORANGE TIMEOUT ST (SAFETY) ×2 IMPLANT
DRAPE LG THREE QUARTER DISP (DRAPES) ×2 IMPLANT
DRESSING TELFA 8X3 (GAUZE/BANDAGES/DRESSINGS) ×4 IMPLANT
DRSG OPSITE POSTOP 4X10 (GAUZE/BANDAGES/DRESSINGS) ×2 IMPLANT
DURAPREP 26ML APPLICATOR (WOUND CARE) ×2 IMPLANT
ELECT REM PT RETURN 9FT ADLT (ELECTROSURGICAL) ×2
ELECTRODE REM PT RTRN 9FT ADLT (ELECTROSURGICAL) ×1 IMPLANT
EXTRACTOR VACUUM M CUP 4 TUBE (SUCTIONS) IMPLANT
GLOVE BIO SURGEON STRL SZ7 (GLOVE) ×2 IMPLANT
GLOVE BIOGEL PI IND STRL 7.0 (GLOVE) ×2 IMPLANT
GLOVE BIOGEL PI INDICATOR 7.0 (GLOVE) ×2
GOWN PREVENTION PLUS LG XLONG (DISPOSABLE) ×2 IMPLANT
GOWN STRL REIN XL XLG (GOWN DISPOSABLE) ×2 IMPLANT
KIT ABG SYR 3ML LUER SLIP (SYRINGE) IMPLANT
NEEDLE HYPO 22GX1.5 SAFETY (NEEDLE) ×2 IMPLANT
NEEDLE HYPO 25X5/8 SAFETYGLIDE (NEEDLE) IMPLANT
NS IRRIG 1000ML POUR BTL (IV SOLUTION) ×2 IMPLANT
PACK C SECTION WH (CUSTOM PROCEDURE TRAY) ×2 IMPLANT
PAD ABD 7.5X8 STRL (GAUZE/BANDAGES/DRESSINGS) ×4 IMPLANT
PAD OB MATERNITY 4.3X12.25 (PERSONAL CARE ITEMS) ×2 IMPLANT
RTRCTR C-SECT PINK 25CM LRG (MISCELLANEOUS) IMPLANT
SLEEVE SCD COMPRESS KNEE MED (MISCELLANEOUS) IMPLANT
SPONGE SURGIFOAM ABS GEL 12-7 (HEMOSTASIS) ×2 IMPLANT
STAPLER VISISTAT 35W (STAPLE) IMPLANT
STRIP CLOSURE SKIN 1/2X4 (GAUZE/BANDAGES/DRESSINGS) ×2 IMPLANT
SUT PDS AB 0 CTX 60 (SUTURE) IMPLANT
SUT PLAIN 0 NONE (SUTURE) IMPLANT
SUT SILK 0 TIES 10X30 (SUTURE) IMPLANT
SUT VIC AB 0 CT1 36 (SUTURE) ×6 IMPLANT
SUT VIC AB 3-0 CT1 27 (SUTURE) ×1
SUT VIC AB 3-0 CT1 TAPERPNT 27 (SUTURE) ×1 IMPLANT
SUT VIC AB 4-0 KS 27 (SUTURE) IMPLANT
SYR CONTROL 10ML LL (SYRINGE) ×2 IMPLANT
TOWEL OR 17X24 6PK STRL BLUE (TOWEL DISPOSABLE) ×6 IMPLANT
TRAY FOLEY CATH 14FR (SET/KITS/TRAYS/PACK) ×2 IMPLANT
WATER STERILE IRR 1000ML POUR (IV SOLUTION) IMPLANT

## 2012-09-16 NOTE — Op Note (Signed)
I was scrubbed for entire procedure. Anne Curtis, M.D., Evern Core

## 2012-09-16 NOTE — H&P (Signed)
Attestation of Attending Supervision of Advanced Practitioner/Fellow (CNM/NP/Fellow): Evaluation and management procedures were performed by the Advanced Practitioner/fellow under my supervision and collaboration.  I have reviewed the Advanced Practitioner's note and chart, and I agree with the management and plan.  HARRAWAY-SMITH, Darrek Leasure 4:54 PM

## 2012-09-16 NOTE — Op Note (Signed)
Anne Curtis   PROCEDURE DATE: 09/16/2012   PREOPERATIVE DIAGNOSIS: Intrauterine pregnancy at  [redacted]w[redacted]d weeks gestation; prior cesarean section x 1, declined TOLAC; Protein S deficiency  POSTOPERATIVE DIAGNOSIS: The same  PROCEDURE: Repeat Low Transverse Cesarean Section  SURGEON:  Dr. Eber Jones L. Harraway-Smith  ASSISTANT:  Napoleon Form, MD   INDICATIONS: Anne Curtis is a 37 y.o. [phone] at [redacted]w[redacted]d here for scheduled, elective cesarean section secondary to the indications listed under preoperative diagnosis; please see preoperative note for further details.  The risks of cesarean section were discussed with the patient including but were not limited to: bleeding which may require transfusion or reoperation; infection which may require antibiotics; injury to bowel, bladder, ureters or other surrounding organs; injury to the fetus; need for additional procedures including hysterectomy in the event of a life-threatening hemorrhage; placental abnormalities wth subsequent pregnancies, incisional problems, thromboembolic phenomenon and other postoperative/anesthesia complications.   The patient concurred with the proposed plan, giving informed written consent for the procedure.    FINDINGS:  Viable female infant in cephalic presentation.  Apgars 9 and 9.  Clear amniotic fluid.  Intact placenta, nuchal cord x 2, three vessel.  Normal uterus, fallopian tubes and ovaries bilaterally.  ANESTHESIA: Spinal INTRAVENOUS FLUIDS: 3000 ml ESTIMATED BLOOD LOSS: 800 ml URINE OUTPUT:  600 ml SPECIMENS: Placenta sent to pathology/L&D COMPLICATIONS: None immediate  PROCEDURE IN DETAIL:  The patient preoperatively received intravenous antibiotics and had sequential compression devices applied to her lower extremities.  She was then taken to the operating room where spinal anesthesia was administered and was found to be adequate. She was then placed in a dorsal supine position with a leftward tilt, and prepped and draped in a  sterile manner.  A foley catheter was placed into her bladder and attached to constant gravity.  After an adequate timeout was performed, a Pfannenstiel skin incision was made with scalpel and carried through to the underlying layer of fascia. The fascia was incised in the midline, and this incision was extended bilaterally using the Mayo scissors.  Kocher clamps were applied to the superior aspect of the fascial incision and the underlying rectus muscles were dissected off bluntly. A similar process was carried out on the inferior aspect of the fascial incision. The rectus muscles were separated in the midline bluntly and the peritoneum was entered bluntly. Attention was turned to the lower uterine segment where a low transverse hysterotomy incision was made with a scalpel and extended bilaterally bluntly.  The infant was successfully delivered, the cord was clamped and cut and the infant was handed over to awaiting neonatology team. Uterine massage was then administered, and the placenta delivered intact with a three-vessel cord. The uterus was then cleared of clot and debris.  The hysterotomy was closed with 0 Vicryl in a running locked fashion, and an imbricating layer was also placed with the same suture. The pelvis was cleared of all clot and debris. Hemostasis was confirmed on all surfaces.  The peritoneum and the muscles were reapproximated using 0Vicryl in 1 interrupted suture. Gel Foam was placed over the rectus muscles. The fascia was then closed using 0 Vicryl in a running fashion.  The subcutaneous layer was irrigated, then reapproximated with 3-0 vicryl in a running fashion, and the skin was closed with a 4-0 Vicryl subcuticular stitch. 30 ml of 0.5% Marcaine was injected into the subcutaneous tissues around the incision. The patient tolerated the procedure well. Sponge, lap, instrument and needle counts were correct x 2.  She  was taken to the recovery room in stable condition.   Napoleon Form,  MD 09/16/2012 3:30 PM

## 2012-09-16 NOTE — Anesthesia Postprocedure Evaluation (Signed)
  Anesthesia Post-op Note  Patient: Anne Curtis  Procedure(s) Performed: Procedure(s) (LRB) with comments: CESAREAN SECTION (N/A)   Patient is awake, responsive, moving her legs, and has signs of resolution of her numbness. Pain and nausea are reasonably well controlled. Vital signs are stable and clinically acceptable. Oxygen saturation is clinically acceptable. There are no apparent anesthetic complications at this time. Patient is ready for discharge.

## 2012-09-16 NOTE — Anesthesia Preprocedure Evaluation (Signed)
Anesthesia Evaluation  Patient identified by MRN, date of birth, ID band Patient awake    Reviewed: Allergy & Precautions, H&P , Patient's Chart, lab work & pertinent test results  Airway Mallampati: II TM Distance: >3 FB Neck ROM: full    Dental No notable dental hx.    Pulmonary  breath sounds clear to auscultation  Pulmonary exam normal       Cardiovascular Exercise Tolerance: Good Rhythm:regular Rate:Normal     Neuro/Psych    GI/Hepatic   Endo/Other    Renal/GU      Musculoskeletal   Abdominal   Peds  Hematology   Anesthesia Other Findings Off blood thinner for >24 Hrs.  Low Hb noted, and transfusion risk was discussed with this pt. IV access adequate.  Reproductive/Obstetrics                           Anesthesia Physical Anesthesia Plan  ASA: II  Anesthesia Plan: Spinal   Post-op Pain Management:    Induction:   Airway Management Planned:   Additional Equipment:   Intra-op Plan:   Post-operative Plan:   Informed Consent: I have reviewed the patients History and Physical, chart, labs and discussed the procedure including the risks, benefits and alternatives for the proposed anesthesia with the patient or authorized representative who has indicated his/her understanding and acceptance.   Dental Advisory Given  Plan Discussed with: CRNA  Anesthesia Plan Comments: (Lab work confirmed with CRNA in room. Platelets okay. Discussed spinal anesthetic, and patient consents to the procedure:  included risk of possible headache,backache, failed block, allergic reaction, and nerve injury. This patient was asked if she had any questions or concerns before the procedure started. )        Anesthesia Quick Evaluation

## 2012-09-16 NOTE — H&P (Signed)
Anne Curtis is a 37 y.o. female presenting for elective repeat cesarean section.  Maternal Medical History:  Reason for admission: Reason for Admission:   nauseaElective repeat cesarean section  Fetal activity: Perceived fetal activity is normal.   Last perceived fetal movement was within the past hour.    Prenatal complications: Thrombophilia (Protein S deficiency, on low molecular weight heparin and aspirin throughout pregnancy; also heterozygous for MTHFR).     OB History    Grav Para Term Preterm Abortions TAB SAB Ect Mult Living   6 2 1 1 3  3   1      Past Medical History  Diagnosis Date  . Anemia   . Peripheral vascular disease     at risk for clot - on lovenox   Past Surgical History  Procedure Date  . Cesarean section    Family History: family history includes Diabetes in her father and mother. Social History:  reports that she has never smoked. She has never used smokeless tobacco. She reports that she does not drink alcohol or use illicit drugs.   Prenatal Transfer Tool  Maternal Diabetes: No, 1/3 abnormal values on 3 hour GTT Genetic Screening: Declined Maternal Ultrasounds/Referrals: Normal Fetal Ultrasounds or other Referrals:  None Maternal Substance Abuse:  No Significant Maternal Medications:  Meds include: Other:  Significant Maternal Lab Results:  Lab values include: Group B Strep negative Other Comments:  Patient with Protein S deficiency and MTHFR heterozygosity on Lovenox and aspirin throughout pregnancy; history of two prior fetuses with hydrocephalus  Review of Systems  Constitutional: Negative for fever and chills.  Eyes: Negative for blurred vision and double vision.  Respiratory: Negative for shortness of breath.   Gastrointestinal: Negative for nausea, vomiting and abdominal pain.  Neurological: Negative for dizziness and headaches.      Blood pressure 112/63, pulse 108, temperature 98.1 F (36.7 C), temperature source Oral, resp. rate 20,  last menstrual period 12/17/2011, SpO2 100.00%. Maternal Exam:  Abdomen: Surgical scars: low transverse.   Fundal height is size appropriate for dates.   Fetal presentation: vertex     Fetal Exam Fetal Monitor Review: Mode: hand-held doppler probe.   Baseline rate: 138.      Physical Exam  Constitutional: She is oriented to person, place, and time. She appears well-developed and well-nourished. No distress.  HENT:  Head: Normocephalic and atraumatic.  Eyes: Conjunctivae normal and EOM are normal.  Neck: Normal range of motion. Neck supple.  Cardiovascular: Normal rate, regular rhythm and normal heart sounds.   Respiratory: Effort normal and breath sounds normal. No respiratory distress.  GI: Soft. Bowel sounds are normal. There is Tenderness: mild general tenderness.. There is no rebound and no guarding.  Musculoskeletal: Normal range of motion. She exhibits no edema and no tenderness.  Neurological: She is alert and oriented to person, place, and time.  Skin: Skin is warm and dry.  Psychiatric: She has a normal mood and affect.    Prenatal labs: ABO, Rh: --/--/A POS, A POS (12/26 1635) Antibody: NEG (12/26 1635) Rubella: >500.0 (09/18 1037) RPR: NON REACTIVE (12/26 1635)  HBsAg: NEGATIVE (09/18 1037)  HIV: NON REACTIVE (09/18 1037)  GBS:     Assessment/Plan: 37 y.o. [id number] at [redacted]w[redacted]d here for elective repeat low transverse cesarean section - Protein S deficiency:  Off aspirin since 12/26 and fragmin for 24 hours.  Per MFM will need lovenox prophylaxis post-partum for 6 weeks. - Anemia:  2 units RBCs in OR, keep 2 ahead  Napoleon Form 09/16/2012, 12:23 PM

## 2012-09-16 NOTE — Transfer of Care (Signed)
Immediate Anesthesia Transfer of Care Note  Patient: Anne Curtis  Procedure(s) Performed: Procedure(s) (LRB) with comments: CESAREAN SECTION (N/A)  Patient Location: PACU  Anesthesia Type:Spinal  Level of Consciousness: awake  Airway & Oxygen Therapy: Patient Spontanous Breathing  Post-op Assessment: Report given to PACU RN  Post vital signs: Reviewed and stable  Complications: No apparent anesthesia complications

## 2012-09-16 NOTE — Anesthesia Procedure Notes (Signed)

## 2012-09-17 ENCOUNTER — Encounter (HOSPITAL_COMMUNITY): Payer: Self-pay | Admitting: Obstetrics & Gynecology

## 2012-09-17 LAB — CBC
HCT: 18.6 % — ABNORMAL LOW (ref 36.0–46.0)
Hemoglobin: 4.9 g/dL — CL (ref 12.0–15.0)
MCV: 69.1 fL — ABNORMAL LOW (ref 78.0–100.0)
RDW: 21.1 % — ABNORMAL HIGH (ref 11.5–15.5)
WBC: 12.6 10*3/uL — ABNORMAL HIGH (ref 4.0–10.5)

## 2012-09-17 NOTE — Progress Notes (Signed)
UR completed 

## 2012-09-17 NOTE — Progress Notes (Signed)
Patient ID: Filicia Scogin, female   DOB: [birth year], 37 y.o.   MRN: [id number]   S:  RN called to say that patient states she does not feel like she needs blood transfusion. Her husband is not here yet. The patient has been out of bed twice without dizziness or lightheadedness. She has voided. She is not having any significant bleeding and her vitals are stable.  OCeasar Mons Vitals:   09/17/12 0152 09/17/12 0155 09/17/12 0554 09/17/12 1000  BP: 96/63 97/55 97/62  97/61  Pulse: 90 94 82 88  Temp:   98.5 F (36.9 C) 98.2 F (36.8 C)  TempSrc:   Oral Oral  Resp: 18 18 16 16   Weight:      SpO2: 100% 100% 98% 100%   A/P Hgb 6.8-->4.9. Likely partially dilutional. Blood loss at c/s was minimal. Asymptomatic.  Will discuss with pt's husband when he arrives. If still not wanting transfusion, will recheck CBC in AM. IV saline locked but will remain in place. Discussed with Dr. Macon Large.  Napoleon Form, MD 09/17/2012 12:38 PM

## 2012-09-17 NOTE — Addendum Note (Signed)
Addendum  created 09/17/12 0755 by Orlie Pollen, CRNA   Modules edited:Notes Section

## 2012-09-17 NOTE — Addendum Note (Signed)
Addendum  created 09/17/12 0749 by Orlie Pollen, CRNA   Modules edited:Charges VN, Notes Section

## 2012-09-17 NOTE — Anesthesia Postprocedure Evaluation (Signed)
  Anesthesia Post Note  Patient: Anne Curtis  Procedure(s) Performed: Procedure(s) (LRB): CESAREAN SECTION (N/A)  Anesthesia type: spinal  Patient location: Mother/Baby  Post pain: Pain level controlled  Post assessment: Post-op Vital signs reviewed  Last Vitals:  Filed Vitals:   09/17/12 0554  BP: 97/62  Pulse: 82  Temp: 36.9 C  Resp: 16    Post vital signs: Reviewed  Level of consciousness:alert  Complications: No apparent anesthesia complications

## 2012-09-17 NOTE — Progress Notes (Signed)
Notified Dr. Fara Boros of patient's hemoglobin this morning of 4.9.  No orders placed at this time. Will continue to closely monitor patient.  Earl Gala, Linda Hedges Mason

## 2012-09-17 NOTE — Anesthesia Postprocedure Evaluation (Signed)
  Anesthesia Post-op Note  Patient: Anne Curtis  Procedure(s) Performed: Procedure(s) (LRB) with comments: CESAREAN SECTION (N/A)  Patient Location: PACU and Mother/Baby  Anesthesia Type:Spinal  Level of Consciousness: awake, alert , oriented and patient cooperative  Airway and Oxygen Therapy: Patient Spontanous Breathing  Post-op Pain: none  Post-op Assessment: Post-op Vital signs reviewed and Patient's Cardiovascular Status Stable  Post-op Vital Signs: Reviewed and stable  Complications: No apparent anesthesia complications

## 2012-09-17 NOTE — Progress Notes (Signed)
Subjective: Postpartum Day 1: Cesarean Delivery Patient reports incisional pain, tolerating PO and + flatus.  Foley cath still in. Pt states she has been out of bed, slight dizziness/lightheadedness.   Objective: Vital signs in last 24 hours: Temp:  [98.1 F (36.7 C)-98.7 F (37.1 C)] 98.5 F (36.9 C) (01/03 0554) Pulse Rate:  [74-108] 82  (01/03 0554) Resp:  [12-20] 16  (01/03 0554) BP: (93-114)/(47-63) 97/62 mmHg (01/03 0554) SpO2:  [98 %-100 %] 98 % (01/03 0554) Weight:  [87.998 kg (194 lb)] 87.998 kg (194 lb) (01/02 1700)  Physical Exam:  General: alert, cooperative and no distress Lochia: appropriate Uterine Fundus: firm Incision: no significant drainage, no dehiscence, no significant erythema DVT Evaluation: No evidence of DVT seen on physical exam. Negative Homan's sign. No cords or calf tenderness. No significant calf/ankle edema.   Basename 09/17/12 0505 09/16/12 1200  HGB 4.9* 6.8*  HCT 18.6* 25.0*    Assessment/Plan: Status post Cesarean section. Postoperative course complicated by Anemia Pt's preop Hgb 6.8 now 4.9. EBL was 800 ml, probably less. Some of this drop is likely dilutional.  Discussed blood transfusion since pt is mildly symptomatic.  Pt would like to talk to her husband first. He will be here later this morning. Otherwise routine post-op care.  Napoleon Form 09/17/2012, 10:28 AM

## 2012-09-18 LAB — CBC
Hemoglobin: 4.9 g/dL — CL (ref 12.0–15.0)
MCH: 18.6 pg — ABNORMAL LOW (ref 26.0–34.0)
MCHC: 26.5 g/dL — ABNORMAL LOW (ref 30.0–36.0)
MCV: 70.3 fL — ABNORMAL LOW (ref 78.0–100.0)

## 2012-09-18 NOTE — Progress Notes (Signed)
POD #2  S. No problems. She prefers to stay another day and go home tomorrow. No problem ambulating, voiding, breastfeeding.  O. VSS, AF     Heart- rrr     Lungs- CTAB     Abd- benign     Ext- negative Homan's, no sign of DVT     HBG 4.9 (no change from yesterday)  A/P. POD #2 post op- stable with anemia. She declines a transfusion.     Plan for discharge home tomorrow.

## 2012-09-19 DIAGNOSIS — D6859 Other primary thrombophilia: Secondary | ICD-10-CM | POA: Diagnosis present

## 2012-09-19 MED ORDER — INTEGRA F 125-1 MG PO CAPS: 1.0000 | ORAL_CAPSULE | Freq: Every day | ORAL | Status: DC

## 2012-09-19 MED ORDER — IBUPROFEN 600 MG PO TABS: 600.0000 mg | ORAL_TABLET | Freq: Four times a day (QID) | ORAL | Status: DC | PRN

## 2012-09-19 MED ORDER — OXYCODONE-ACETAMINOPHEN 5-325 MG PO TABS: 1.0000 | ORAL_TABLET | ORAL | Status: DC | PRN

## 2012-09-19 MED ORDER — ENOXAPARIN SODIUM 40 MG/0.4ML ~~LOC~~ SOLN: 40.0000 mg | SUBCUTANEOUS | Status: DC

## 2012-09-19 NOTE — Discharge Summary (Signed)
Obstetric Discharge Summary Reason for Admission: cesarean section Prenatal Procedures: NST and ultrasound Intrapartum Procedures: cesarean: low cervical, transverse Postpartum Procedures: none Complications-Operative and Postpartum: severe, mildly symptomatic anemia, not orthostatic. Declined transfusion.  Hemoglobin  Date Value Range Status  09/18/2012 4.9* 12.0 - 15.0 g/dL Final     REPEATED TO VERIFY     CRITICAL RESULT CALLED TO, READ BACK BY AND VERIFIED WITH:     SPOKE TO Anne Curtis @ 0600 ON [id number] BY [facility]     HCT  Date Value Range Status  09/18/2012 18.5* 36.0 - 46.0 % Final    Physical Exam:  General: alert, cooperative, appears stated age and no distress Lochia: appropriate Uterine Fundus: firm Incision: healing well DVT Evaluation: No cords or calf tenderness.  Discharge Diagnoses: Term Pregnancy-delivered and severe, mildly symptomatic anemia, Protein S deficiency  Discharge Information: Date: 09/19/2012 Activity: pelvic rest and anemia precautions, resume activity slowoy. No lifting >10 lb x 2 weeks.  Diet: routine and increase dietary iron. Medications: Ibuprofen, Iron, Percocet and Lovenox (electively x 6 weeks) Condition: stable Instructions: refer to practice specific booklet and BLEEDING PRECAUTIONS due to severe anemia and Lovenox.  Discharge to: home Follow-up Information    Follow up with [facility]. In 1 week for Hgb and incision check.   Contact information:   [address] [place] [address] [phone]      Follow up with [facility]. (As needed in emergencies)    Contact information:   [address] [id number] mc [address] [place] [address] [phone]      Follow up with [facility], [place]. (Hematologist)    Contact information:   [address] [place] [address] [phone]          Newborn Data: Live born female  Birth Weight: 6 lb 14.6 oz  (3135 g) APGAR: 9, 9  Home with mother. OP circ Breastfeeding  Anne Curtis 09/19/2012, 11:22 AM

## 2012-09-20 LAB — TYPE AND SCREEN
Antibody Screen: NEGATIVE
Unit division: 0

## 2012-09-27 ENCOUNTER — Ambulatory Visit (INDEPENDENT_AMBULATORY_CARE_PROVIDER_SITE_OTHER): Payer: 59 | Admitting: General Practice

## 2012-09-27 VITALS — BP 106/68 | HR 72 | Temp 96.9°F | Ht 64.5 in | Wt 181.3 lb

## 2012-09-27 DIAGNOSIS — D649 Anemia, unspecified: Secondary | ICD-10-CM

## 2012-09-27 DIAGNOSIS — Z5189 Encounter for other specified aftercare: Secondary | ICD-10-CM

## 2012-09-27 DIAGNOSIS — Z09 Encounter for follow-up examination after completed treatment for conditions other than malignant neoplasm: Secondary | ICD-10-CM

## 2012-09-27 NOTE — Progress Notes (Signed)
Patient came in today for staple/suture removal however only steri strips were present. Steri strip were dry and intact, some old blood drainage remained about 1cm on two of the steri strips. Incision looked dry, intact, no edema, or redness noted upon assessment, no active drainage. Reminded patient of post partum appt on 2/6 @ 12:45. Told patient to call us or return to [facility] should she experience incisional bleeding, drainage, redness or swelling.

## 2012-09-27 NOTE — Addendum Note (Signed)
Addended by: Franchot Mimes on: 09/27/2012 12:26 PM   Modules accepted: Orders

## 2012-10-21 ENCOUNTER — Encounter: Payer: Self-pay | Admitting: Medical

## 2012-10-21 ENCOUNTER — Ambulatory Visit (INDEPENDENT_AMBULATORY_CARE_PROVIDER_SITE_OTHER): Payer: 59 | Admitting: Medical

## 2012-10-21 VITALS — BP 103/68 | HR 70 | Temp 97.5°F | Ht 65.0 in | Wt 182.6 lb

## 2012-10-21 DIAGNOSIS — D649 Anemia, unspecified: Secondary | ICD-10-CM

## 2012-10-21 DIAGNOSIS — K5900 Constipation, unspecified: Secondary | ICD-10-CM

## 2012-10-21 LAB — CBC
HCT: 32.2 % — ABNORMAL LOW (ref 36.0–46.0)
MCH: 23 pg — ABNORMAL LOW (ref 26.0–34.0)
MCV: 75.4 fL — ABNORMAL LOW (ref 78.0–100.0)
Platelets: 337 10*3/uL (ref 150–400)
RDW: 23.7 % — ABNORMAL HIGH (ref 11.5–15.5)

## 2012-10-21 MED ORDER — DOCUSATE SODIUM 100 MG PO CAPS: 100.0000 mg | ORAL_CAPSULE | Freq: Two times a day (BID) | ORAL | Status: DC

## 2012-10-21 NOTE — Progress Notes (Signed)
Patient ID: Orva Riles, female   DOB: Jan 10, 1976, 37 y.o.   MRN: [id number]  . Subjective:     Lynnex Fulp is a 37 y.o. female who presents for a postpartum visit. She is 4 weeks postpartum following a low cervical transverse Cesarean section. I have fully reviewed the prenatal and intrapartum course. The delivery was at 39.1 gestational weeks. Outcome: repeat cesarean section, low transverse incision. Anesthesia: spinal. Postpartum course has been normal. Baby's course has been normal. Baby is feeding by both breast and bottle - unknown. Bleeding no bleeding. Bowel function is abnormal: occasional constipation. Bladder function is normal. Patient is not sexually active. Contraception method is abstinence. Postpartum depression screening: negative. The patient states that she occasionally feels sad, but this is unchanged from prior to this pregnancy. The patient states that she gets sad when she thinks about her parents deaths. The patient denies weakness, fatigue or dizziness. She is currently taking iron supplements.   The following portions of the patient's history were reviewed and updated as appropriate: allergies, current medications, past family history, past medical history, past social history, past surgical history and problem list.  Review of Systems Pertinent items are noted in HPI.   Objective:    BP 103/68  Pulse 70  Temp 97.5 F (36.4 C)  Ht 5\' 5"  (1.651 m)  Wt 182 lb 9.6 oz (82.827 kg)  BMI 30.39 kg/m2  Breastfeeding? Yes  General:  alert and no distress   Breasts:  Not performed  Lungs: clear to auscultation bilaterally  Heart:  regular rate and rhythm, S1, S2 normal, no murmur, click, rub or gallop  Abdomen: abnormal findings:  mild tenderness in the LLQ C-section scar well healed. Steri strips removed today. No redness, warmth or tenderness noted.    Vulva:  not evaluated  Vagina: not evaluated  Cervix:  not evaluated  Corpus: not examined  Adnexa:  not evaluated  Rectal  Exam: Not performed.        Assessment:    Normal postpartum exam. Pap smear not done at today's visit.  Post partum anemia Constipation , occasional  Plan:    1. Contraception: none; desires fertility in the near future 2. CBC drawn today 3. Follow up in: 1 year or as needed.  for annual exam 4. Rx for colace sent to patient's pharmacy 5. AVS given with information about fiber and constipation  Freddi Starr, PA-C 10/21/2012 1:49 PM

## 2012-10-21 NOTE — Patient Instructions (Addendum)
Constipation, Adult Constipation is when a person:  Poops (bowel movement) less than 3 times a week.  Has a hard time pooping.  Has poop that is dry, hard, or bigger than normal. HOME CARE   Eat more fiber, such as fruits, vegetables, whole grains like brown rice, and beans.  Eat less fatty foods and sugar. This includes [place] fries, hamburgers, cookies, candy, and soda.  If you are not getting enough fiber from food, take products with added fiber in them (supplements).  Drink enough fluid to keep your pee (urine) clear or pale yellow.  Go to the restroom when you feel like you need to poop. Do not hold it.  Only take medicine as told by your doctor. Do not take medicines that help you poop (laxatives) without talking to your doctor first.  Exercise on a regular basis, or as told by your doctor. GET HELP RIGHT AWAY IF:   You have bright red blood in your poop (stool).  Your constipation lasts more than 4 days or gets worse.  You have belly (abdomen) or butt (rectal) pain.  You have thin poop (as thin as a pencil).  You lose weight, and it cannot be explained. MAKE SURE YOU:   Understand these instructions.  Will watch your condition.  Will get help right away if you are not doing well or get worse. Document Released: 02/18/2008 Document Revised: 11/24/2011 Document Reviewed: 08/05/2011 [facility] Patient Information 2013 [place], [place]. Fiber Content in Foods Drinking plenty of fluids and consuming foods high in fiber can help with constipation. See the list below for the fiber content of some common foods. Starches and Grains / Dietary Fiber (g)  Cheerios, 1 cup / 3 g  Kellogg's Corn Flakes, 1 cup / 0.7 g  Rice Krispies, 1  cup / 0.3 g  Quaker Oat Life Cereal,  cup / 2.1 g  Oatmeal, instant (cooked),  cup / 2 g  Kellogg's Frosted Mini Wheats, 1 cup / 5.1 g  Rice, brown, long-grain (cooked), 1 cup / 3.5 g  Rice, white, long-grain (cooked), 1 cup / 0.6  g  Macaroni, cooked, enriched, 1 cup / 2.5 g Legumes / Dietary Fiber (g)  Beans, baked, canned, plain or vegetarian,  cup / 5.2 g  Beans, kidney, canned,  cup / 6.8 g  Beans, pinto, dried (cooked),  cup / 7.7 g  Beans, pinto, canned,  cup / 5.5 g Breads and Crackers / Dietary Fiber (g)  Graham crackers, plain or honey, 2 squares / 0.7 g  Saltine crackers, 3 squares / 0.3 g  Pretzels, plain, salted, 10 pieces / 1.8 g  Bread, whole-wheat, 1 slice / 1.9 g  Bread, white, 1 slice / 0.7 g  Bread, raisin, 1 slice / 1.2 g  Bagel, plain, 3 oz / 2 g  Tortilla, flour, 1 oz / 0.9 g  Tortilla, corn, 1 small / 1.5 g  Bun, hamburger or hotdog, 1 small / 0.9 g Fruits / Dietary Fiber (g)  Apple, raw with skin, 1 medium / 4.4 g  Applesauce, sweetened,  cup / 1.5 g  Banana,  medium / 1.5 g  Grapes, 10 grapes / 0.4 g  Orange, 1 small / 2.3 g  Raisin, 1.5 oz / 1.6 g  Melon, 1 cup / 1.4 g Vegetables / Dietary Fiber (g)  Green beans, canned,  cup / 1.3 g  Carrots (cooked),  cup / 2.3 g  Broccoli (cooked),  cup / 2.8 g  Peas, frozen (  cooked),  cup / 4.4 g  Potatoes, mashed,  cup / 1.6 g  Lettuce, 1 cup / 0.5 g  Corn, canned,  cup / 1.6 g  Tomato,  cup / 1.1 g Document Released: 01/18/2007 Document Revised: 11/24/2011 Document Reviewed: 03/15/2007 [facility] Patient Information 2013 [place], [place].

## 2012-10-25 ENCOUNTER — Telehealth: Payer: Self-pay | Admitting: *Deleted

## 2012-10-25 NOTE — Telephone Encounter (Signed)
Called patient with pac[employer] interpreter [phone] and informed patient of improving hgb but to still take her iron pill every day. Patient verbalized understanding and had no further questions

## 2012-10-25 NOTE — Telephone Encounter (Signed)
Message copied by Gerome Apley on Mon Oct 25, 2012  9:45 AM ------      Message from: Freddi Starr      Created: Fri Oct 22, 2012  8:30 AM       Please inform patient that her hgb is improving, but still low of normal. Keep taking PO iron. ------

## 2013-01-24 ENCOUNTER — Encounter: Payer: Self-pay | Admitting: Obstetrics & Gynecology

## 2013-09-14 IMAGING — US US OB FOLLOW-UP
1 series · 12 of 28 positions shown · non-contrast
Comparison: none

[Series 1: us ob follow-up · 12 of 38 slices shown]
[im 2/38]
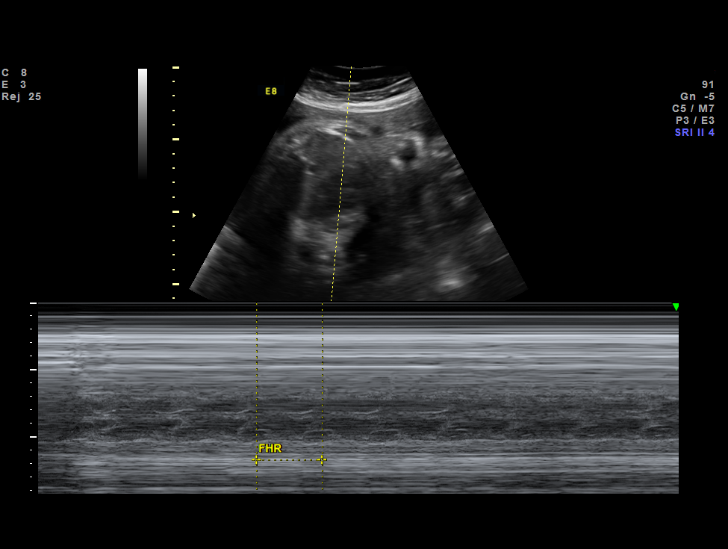
[im 5/38]
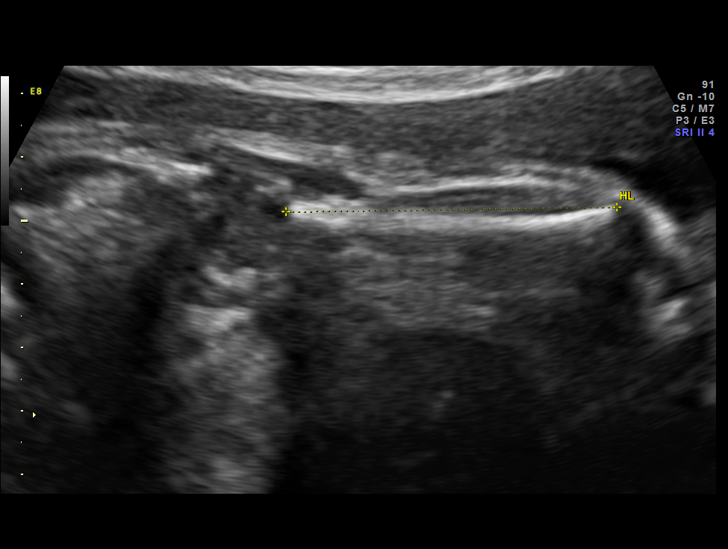
[im 7/38]
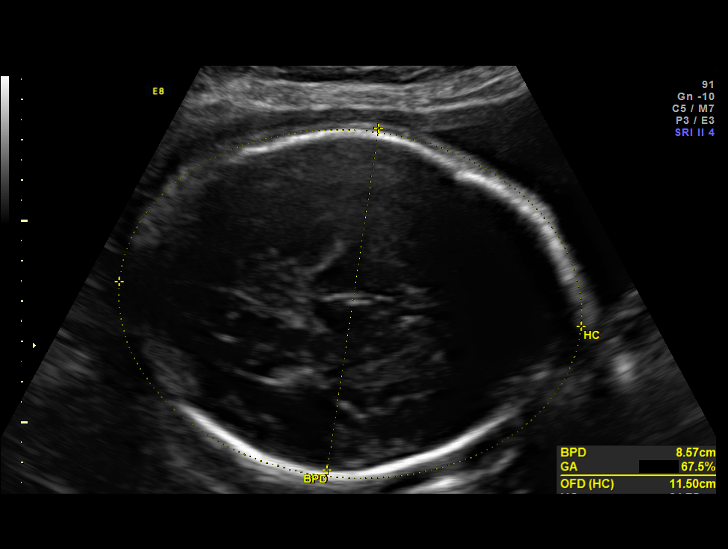
[im 11/38]
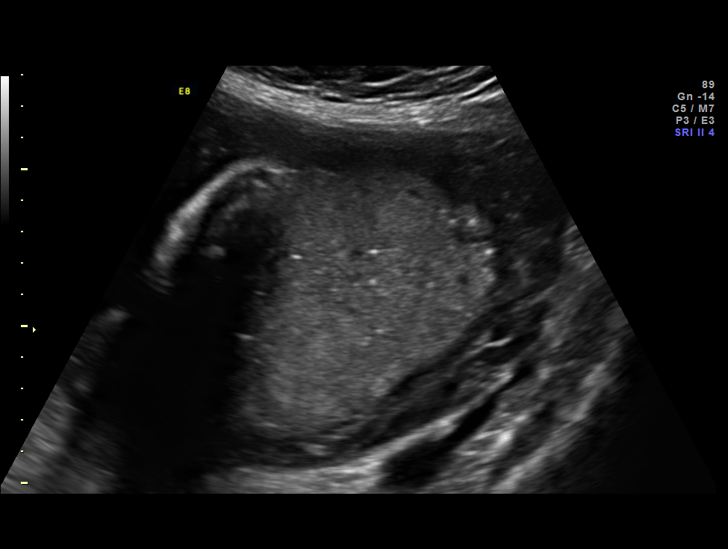
[im 14/38]
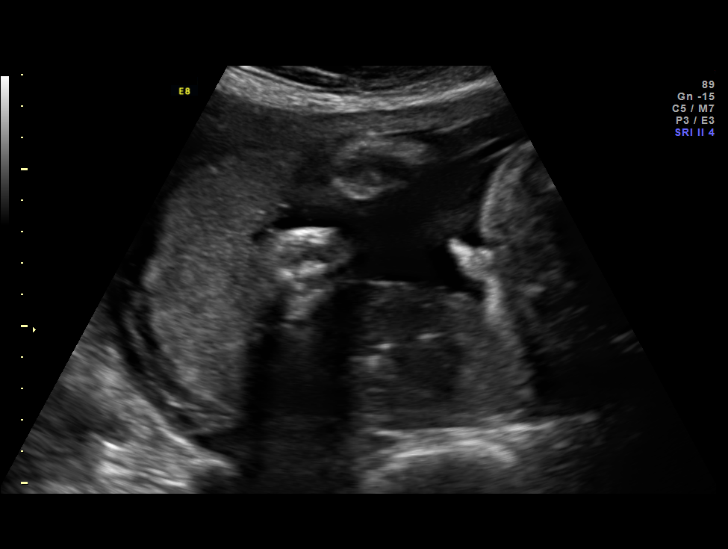
[im 17/38]
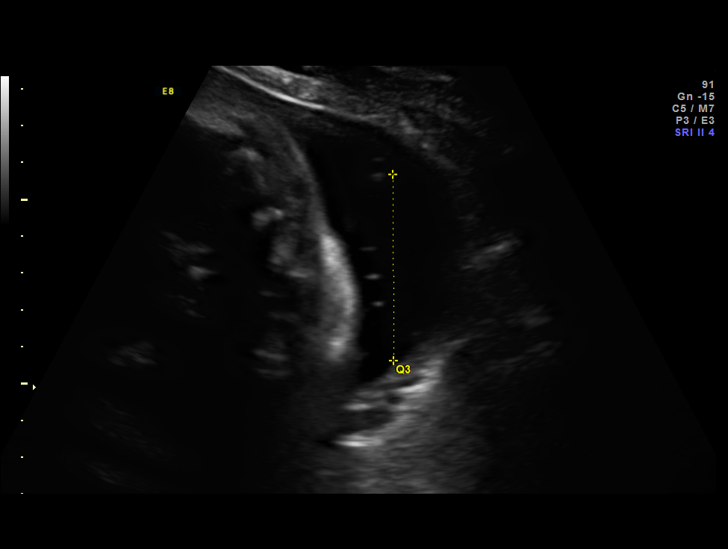
[im 21/38]
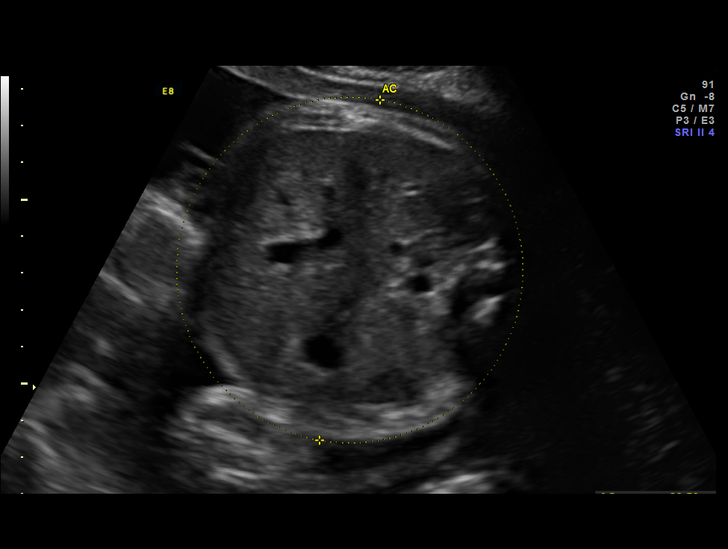
[im 24/38]
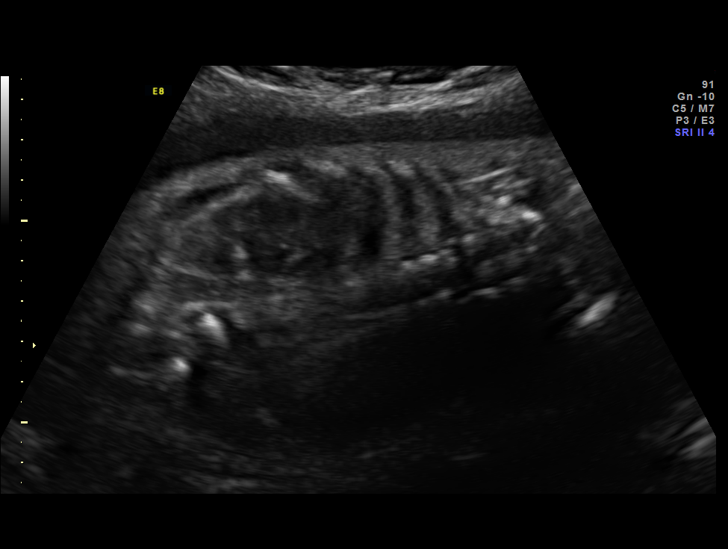
[im 27/38]
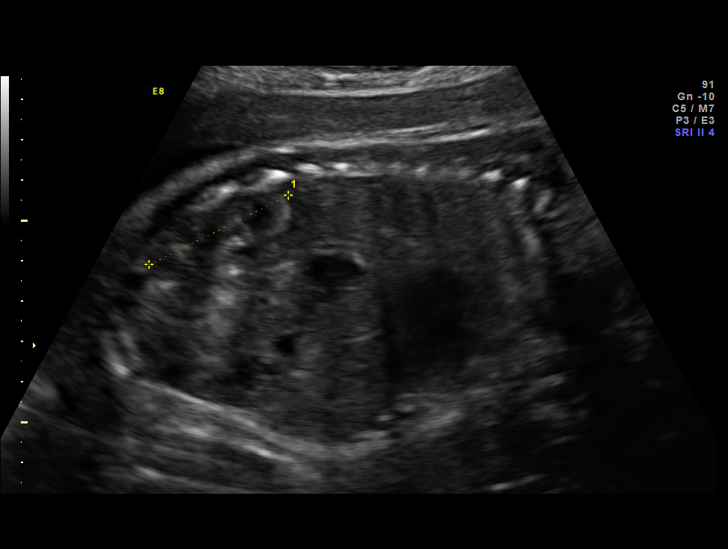
[im 31/38]
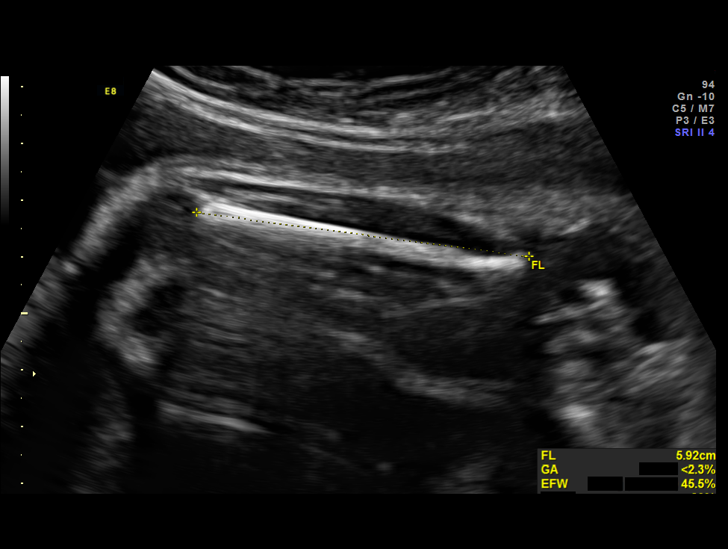
[im 33/38]
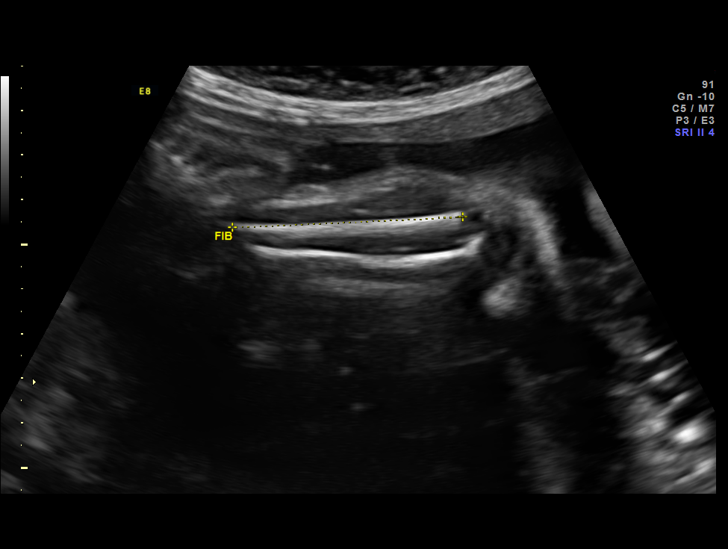
[im 36/38]
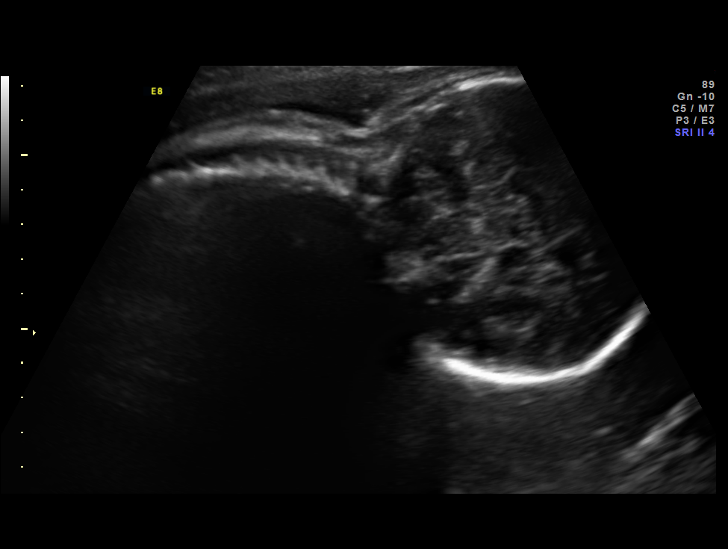

[12 of 28 positions shown; findings below may reference images not displayed]

OBSTETRICS REPORT
                      (Signed Final 08/10/2012 [DATE])

Service(s) Provided

 US OB FOLLOW UP                                       76816.1
Indications

 Poor obstetric history: Previous preterm delivery
 (28 wks)
 Poor obstetrical history (hx 3 SABs)
 Protein S defiency (pt on heparin - 7,500 units
 daily)
 PRIOR C/SECTION
 Anemic
Fetal Evaluation

 Num Of Fetuses:    1
 Fetal Heart Rate:  153                          bpm
 Cardiac Activity:  Observed
 Presentation:      Cephalic
 Placenta:          Posterior, above cervical
                    os
 P. Cord            Previously Visualized
 Insertion:

 Amniotic Fluid
 AFI FV:      Subjectively upper-normal
 AFI Sum:     20.3    cm       76  %Tile     Larg Pckt:    7.28  cm
 RUQ:   7.28    cm   RLQ:    5.73   cm    LUQ:   2.23    cm   LLQ:    5.06   cm
Biometry

 BPD:     85.4  mm     G. Age:  34w 3d                CI:         75.3   70 - 86
 OFD:    113.4  mm                                    FL/HC:      19.0   19.4 -

 HC:     314.5  mm     G. Age:  35w 2d       50  %    HC/AC:      1.05   0.96 -

 AC:     298.8  mm     G. Age:  33w 6d       53  %    FL/BPD:     70.0   71 - 87
 FL:      59.8  mm     G. Age:  31w 1d      < 3  %    FL/AC:      20.0   20 - 24
 HUM:     52.6  mm     G. Age:  30w 5d      < 5  %
 ULN:     52.2  mm     G. Age:  33w 0d       27  %
 TIB:     50.9  mm     G. Age:  30w 2d      < 5  %
 RAD:     44.3  mm     G. Age:  31w 2d       35  %
 FIB:     51.6  mm     G. Age:  31w 4d       48  %

 Est. FW:    9101  gm    4 lb 12 oz      47  %
Gestational Age

 Clinical EDD:  33w 6d                                        EDD:   09/22/12
 U/S Today:     33w 5d                                        EDD:   09/23/12
 Best:          33w 6d     Det. By:  Clinical EDD             EDD:   09/22/12
Anatomy

 Cranium:          Previously seen        Aortic Arch:      Previously seen
 Fetal Cavum:      Previously seen        Ductal Arch:      Not well visualized
 Ventricles:       Previously seen        Diaphragm:        Previously seen
 Choroid Plexus:   Previously seen        Stomach:          Appears normal, left
                                                            sided
 Cerebellum:       Previously seen        Abdomen:          Previously seen
 Posterior Fossa:  Previously seen        Abdominal Wall:   Previously seen
 Nuchal Fold:      Not applicable (>20    Cord Vessels:     Previously seen
                   wks GA)
 Face:             Orbits and profile     Kidneys:          Appear normal
                   previously seen
 Lips:             Previously seen        Bladder:          Appears normal
 Heart:            Previously seen        Spine:            Previously seen
 RVOT:             Previously seen        Lower             Previously seen
                                          Extremities:
 LVOT:             Previously seen        Upper             Previously seen
                                          Extremities:

 Other:  Male gender. Heels and Rt 5th digit previously seen.
Cervix Uterus Adnexa

 Cervix:       Not visualized (advanced GA >89wks)
Impression

 IUP at 33+6 weeks
 Normal interval anatomy; anatomic survey complete except
 for DA
 High normal amniotic fluid volume
 Appropriate interval growth with EFW at the 47th %tile

 questions or concerns.

## 2014-07-17 ENCOUNTER — Encounter: Payer: Self-pay | Admitting: Medical

## 2015-11-26 ENCOUNTER — Ambulatory Visit: Payer: Self-pay | Admitting: Obstetrics and Gynecology

## 2015-11-30 ENCOUNTER — Ambulatory Visit: Payer: Self-pay | Admitting: Obstetrics & Gynecology

## 2015-12-20 ENCOUNTER — Ambulatory Visit: Payer: Self-pay | Admitting: Obstetrics & Gynecology

## 2018-09-21 ENCOUNTER — Other Ambulatory Visit: Payer: Self-pay

## 2018-09-21 ENCOUNTER — Ambulatory Visit: Payer: Self-pay | Attending: Nurse Practitioner | Admitting: Nurse Practitioner

## 2018-09-21 ENCOUNTER — Encounter: Payer: Self-pay | Admitting: Nurse Practitioner

## 2018-09-21 VITALS — BP 122/68 | HR 94 | Temp 98.3°F | Wt 201.6 lb

## 2018-09-21 DIAGNOSIS — Z79890 Hormone replacement therapy (postmenopausal): Secondary | ICD-10-CM | POA: Insufficient documentation

## 2018-09-21 DIAGNOSIS — Z0000 Encounter for general adult medical examination without abnormal findings: Secondary | ICD-10-CM | POA: Insufficient documentation

## 2018-09-21 DIAGNOSIS — I739 Peripheral vascular disease, unspecified: Secondary | ICD-10-CM | POA: Insufficient documentation

## 2018-09-21 DIAGNOSIS — E039 Hypothyroidism, unspecified: Secondary | ICD-10-CM

## 2018-09-21 DIAGNOSIS — Z833 Family history of diabetes mellitus: Secondary | ICD-10-CM | POA: Insufficient documentation

## 2018-09-21 DIAGNOSIS — Z3189 Encounter for other procreative management: Secondary | ICD-10-CM

## 2018-09-21 NOTE — Progress Notes (Signed)
Assessment & Plan:  Naveen was seen today for thyroid problem.  Diagnoses and all orders for this visit:  Acquired hypothyroidism -     TSH  Encounter for fertility planning -     Ambulatory referral to Gynecology  Routine adult health maintenance -     CMP14+EGFR -     Lipid panel -     CBC    Patient has been counseled on age-appropriate routine health concerns for screening and prevention. These are reviewed and up-to-date. Referrals have been placed accordingly. Immunizations are up-to-date or declined.    Subjective:   Chief Complaint  Patient presents with  . Thyroid Problem   HPI Thelma Lorenzetti 43 y.o. female presents to office today to establish care. She has an onsite interpreter with her today. Her insurance recently changed and she is requesting a referral to a gynecologist as she is trying to conceive again. She has 2 children with most recent child born 21 years ago. She also has a history of stillbirth with IUFD due to hydrocephalus.  Menstrual History:             OB History    Grav Para Term Preterm Abortions TAB SAB Ect Mult Living   '6 2 1 1 3  3   1    '$ Hypothyroidism: Patient presents for evaluation of thyroid function. She denies fatigue, weight changes, heat/cold intolerance, bowel/skin changes or CVS symptoms. She endorses a history of hypothyroidism however I am unable to find any documentation regarding this or any previous prescriptions. Previous thyroid studies include TSH. She states her last thyroid dosage was 50mg daily.     Review of Systems  Constitutional: Negative for fever, malaise/fatigue and weight loss.  HENT: Negative.  Negative for nosebleeds.   Eyes: Negative.  Negative for blurred vision, double vision and photophobia.  Respiratory: Negative.  Negative for cough and shortness of breath.   Cardiovascular: Negative.  Negative for chest pain, palpitations and leg swelling.  Gastrointestinal: Negative.  Negative for heartburn, nausea  and vomiting.  Genitourinary: Negative.   Musculoskeletal: Negative.  Negative for myalgias.  Neurological: Negative.  Negative for dizziness, focal weakness, seizures and headaches.  Psychiatric/Behavioral: Negative.  Negative for suicidal ideas.    Past Medical History:  Diagnosis Date  . Anemia   . Peripheral vascular disease ([place]    at risk for clot - on lovenox    Past Surgical History:  Procedure Laterality Date  . CESAREAN SECTION    . CESAREAN SECTION  09/16/2012   Procedure: CESAREAN SECTION;  Surgeon: CLavonia Drafts MD;  [facility];  Service: Obstetrics;  Laterality: N/A;    Family History  Problem Relation Age of Onset  . Diabetes Mother   . Diabetes Father     Social History Reviewed with no changes to be made today.   Outpatient Medications Prior to Visit  Medication Sig Dispense Refill  . levothyroxine (SYNTHROID, LEVOTHROID) 75 MCG tablet Take 75 mcg by mouth daily before breakfast.    . docusate sodium (COLACE) 100 MG capsule Take 1 capsule (100 mg total) by mouth 2 (two) times daily. (Patient not taking: Reported on 09/21/2018) 10 capsule 0  . Fe Fum-FePoly-FA-Vit C-Vit B3 (INTEGRA F) 125-1 MG CAPS Take 1 tablet by mouth daily. (Patient not taking: Reported on 09/21/2018) 30 capsule 6  . ibuprofen (ADVIL,MOTRIN) 600 MG tablet Take 1 tablet (600 mg total) by mouth every 6 (six) hours as needed for pain. (Patient not taking: Reported on  09/21/2018) 30 tablet 1  . vitamin B-6 (PYRIDOXINE) 25 MG tablet Take 1 tablet (25 mg total) by mouth 2 (two) times daily. (Patient not taking: Reported on 09/21/2018) 60 tablet 3   No facility-administered medications prior to visit.     No Known Allergies     Objective:    BP 122/68   Pulse 94   Temp 98.3 F (36.8 C) (Oral)   Wt 201 lb 9.6 oz (91.4 kg)   SpO2 99%   BMI 33.55 kg/m  Wt Readings from Last 3 Encounters:  09/21/18 201 lb 9.6 oz (91.4 kg)  10/21/12 182 lb 9.6 oz (82.8 kg)  09/27/12 181 lb 4.8 oz  (82.2 kg)    Physical Exam Vitals signs and nursing note reviewed.  Constitutional:      Appearance: She is well-developed.  HENT:     Head: Normocephalic and atraumatic.  Neck:     Musculoskeletal: Normal range of motion.  Cardiovascular:     Rate and Rhythm: Normal rate and regular rhythm.     Heart sounds: Normal heart sounds. No murmur. No friction rub. No gallop.   Pulmonary:     Effort: Pulmonary effort is normal. No tachypnea or respiratory distress.     Breath sounds: Normal breath sounds. No decreased breath sounds, wheezing, rhonchi or rales.  Chest:     Chest wall: No tenderness.  Abdominal:     General: Bowel sounds are normal.     Palpations: Abdomen is soft.  Musculoskeletal: Normal range of motion.  Skin:    General: Skin is warm and dry.  Neurological:     Mental Status: She is alert and oriented to person, place, and time.     Coordination: Coordination normal.  Psychiatric:        Behavior: Behavior normal. Behavior is cooperative.        Thought Content: Thought content normal.        Judgment: Judgment normal.          Patient has been counseled extensively about nutrition and exercise as well as the importance of adherence with medications and regular follow-up. The patient was given clear instructions to go to ER or return to medical center if symptoms don't improve, worsen or new problems develop. The patient verbalized understanding.   Follow-up: Return for PAP SMEAR .   Gildardo Pounds, FNP-BC [facility] [place], [place]   09/21/2018, 9:35 PM

## 2018-09-22 LAB — CMP14+EGFR
ALBUMIN: 4.2 g/dL (ref 3.5–5.5)
ALK PHOS: 66 IU/L (ref 39–117)
ALT: 22 IU/L (ref 0–32)
AST: 26 IU/L (ref 0–40)
Albumin/Globulin Ratio: 1.4 (ref 1.2–2.2)
BILIRUBIN TOTAL: 0.3 mg/dL (ref 0.0–1.2)
BUN / CREAT RATIO: 14 (ref 9–23)
BUN: 10 mg/dL (ref 6–24)
CHLORIDE: 105 mmol/L (ref 96–106)
CO2: 22 mmol/L (ref 20–29)
CREATININE: 0.71 mg/dL (ref 0.57–1.00)
Calcium: 9.1 mg/dL (ref 8.7–10.2)
GFR calc non Af Amer: 105 mL/min/{1.73_m2} (ref >59)
GFR, EST AFRICAN AMERICAN: 121 mL/min/{1.73_m2} (ref >59)
GLUCOSE: 116 mg/dL — AB (ref 65–99)
Globulin, Total: 3 g/dL (ref 1.5–4.5)
Potassium: 4.6 mmol/L (ref 3.5–5.2)
Sodium: 139 mmol/L (ref 134–144)
TOTAL PROTEIN: 7.2 g/dL (ref 6.0–8.5)

## 2018-09-22 LAB — LIPID PANEL
CHOLESTEROL TOTAL: 153 mg/dL (ref 100–199)
Chol/HDL Ratio: 4.1 ratio (ref 0.0–4.4)
HDL: 37 mg/dL — AB (ref >39)
LDL CALC: 81 mg/dL (ref 0–99)
Triglycerides: 176 mg/dL — ABNORMAL HIGH (ref 0–149)
VLDL CHOLESTEROL CAL: 35 mg/dL (ref 5–40)

## 2018-09-22 LAB — CBC
Hematocrit: 28.2 % — ABNORMAL LOW (ref 34.0–46.6)
Hemoglobin: 7.9 g/dL — ABNORMAL LOW (ref 11.1–15.9)
MCH: 19.8 pg — AB (ref 26.6–33.0)
MCHC: 28 g/dL — ABNORMAL LOW (ref 31.5–35.7)
MCV: 71 fL — AB (ref 79–97)
PLATELETS: 394 10*3/uL (ref 150–450)
RBC: 3.98 x10E6/uL (ref 3.77–5.28)
RDW: 17 % — AB (ref 11.7–15.4)
WBC: 6.1 10*3/uL (ref 3.4–10.8)

## 2018-09-22 LAB — TSH: TSH: 2.32 u[IU]/mL (ref 0.450–4.500)

## 2018-09-29 ENCOUNTER — Other Ambulatory Visit: Payer: Self-pay | Admitting: Nurse Practitioner

## 2018-09-29 ENCOUNTER — Telehealth: Payer: Self-pay | Admitting: Nurse Practitioner

## 2018-09-29 DIAGNOSIS — D6859 Other primary thrombophilia: Secondary | ICD-10-CM

## 2018-09-29 MED ORDER — FERROUS SULFATE 325 (65 FE) MG PO TABS: 325.0000 mg | ORAL_TABLET | Freq: Two times a day (BID) | ORAL | 1 refills | Status: DC

## 2018-09-29 MED ORDER — LEVOTHYROXINE SODIUM 75 MCG PO TABS: 75.0000 ug | ORAL_TABLET | Freq: Every day | ORAL | 1 refills | Status: AC

## 2018-09-29 NOTE — Telephone Encounter (Signed)
Pt called for lab results from 09/21/18. Please call pt with results. [phone].

## 2018-09-29 NOTE — Telephone Encounter (Signed)
Patient called to get their lab results please follow up with patient.

## 2018-09-30 NOTE — Telephone Encounter (Signed)
Name and DOB verified. Patient aware of results. Verbalized understanding to results. Informed of referrals being placed to specialist. Awaiting call from their office to follow up on menstrual cycle and blood disorder. Pt verbalized understanding. She is aware that medication  Has been sent to the pharmacy.   Notes recorded by Claiborne Rigg, NP on 09/29/2018 at 10:44 PM EST Thyroid level is normal. Continue synthroid 75mg . Iron levels are low. Will need to refill your iron tablets and also have your follow up with gynecology regarding recommendations to help control your menstrual cycles and bleeding. I am also referring you to a blood specialist due to your history of a blood clotting disorder in the past. I am sending your iron pills to the pharmacy

## 2018-10-04 ENCOUNTER — Encounter: Payer: Self-pay | Admitting: Internal Medicine

## 2018-10-04 ENCOUNTER — Telehealth: Payer: Self-pay | Admitting: Internal Medicine

## 2018-10-04 NOTE — Telephone Encounter (Signed)
A new hem appt has been scheduled for the pt to see Dr. Melton Alar on 2/6 at 950am. Letter mailed to the pt and notified the referring office of the appt date and time.

## 2018-10-07 ENCOUNTER — Telehealth: Payer: Self-pay | Admitting: Nurse Practitioner

## 2018-10-07 NOTE — Telephone Encounter (Signed)
Patient says she has a question regarding her hematology appt on feb 6th. Please follow up with patient.

## 2018-10-08 NOTE — Telephone Encounter (Signed)
Attempt to call patient back. Left message on voice to return call.

## 2018-10-11 NOTE — Telephone Encounter (Signed)
PER THE REFERRAL COORDINATOR  Anne Curtis:   The patient had a referral for infertility they don't see that and she is uninsured she had to pay out of pocket.    [facility]; Dr. Lyndal Rainbow office at [facility] [phone]    Informed patient of message. Verbalized understanding.

## 2018-10-21 ENCOUNTER — Encounter: Payer: Medicaid Other | Admitting: Internal Medicine

## 2018-11-01 ENCOUNTER — Other Ambulatory Visit: Payer: Medicaid Other | Admitting: Nurse Practitioner

## 2018-11-03 ENCOUNTER — Telehealth: Payer: Self-pay | Admitting: *Deleted

## 2018-11-03 NOTE — Telephone Encounter (Signed)
Medical Assistant used [employer] to contact patient.  Interpreter Name: Gertha Calkin Interpreter #: [id number] Patient states she forgot about the 11/01/2018 appointment and will call back to reschedule.

## 2019-01-19 ENCOUNTER — Telehealth: Payer: Self-pay | Admitting: Nurse Practitioner

## 2019-01-19 NOTE — Telephone Encounter (Signed)
Please call back patient to contact the OBGYN she is going to for infertility  [facility]; Dr. Lyndal Rainbow office at [facility] [phone]

## 2019-01-19 NOTE — Telephone Encounter (Signed)
Pt called in to have lab work for fertility wasn't sure if we could schedule an ov with pcp or lab

## 2019-01-19 NOTE — Telephone Encounter (Signed)
Patient does not have insurance that covers infertility testing. Her OB GYN or fertility specialist will need to order labs and then have patient go to any lab of their choice to have them drawn.

## 2019-04-20 ENCOUNTER — Telehealth: Payer: Self-pay | Admitting: Nurse Practitioner

## 2019-04-20 ENCOUNTER — Ambulatory Visit: Payer: Self-pay | Attending: Family Medicine

## 2019-04-20 ENCOUNTER — Other Ambulatory Visit: Payer: Self-pay

## 2019-04-20 NOTE — Telephone Encounter (Signed)
I called Pt since her appt is at 10:30 for financial TELE visit, LVM that I will call her back in 10 minutes to try to contact her if not we need to reschedule the appt

## 2019-04-20 NOTE — Telephone Encounter (Signed)
I called Pt to try to contact for her TELE visit for financial, LVM inform her that I will return application since we can'tt contact her and she need to call the FO to reschedule anew appt

## 2019-05-05 ENCOUNTER — Telehealth: Payer: Self-pay | Admitting: Nurse Practitioner

## 2019-05-05 NOTE — Telephone Encounter (Signed)
Pt was sent a letter from financial dept. Inform them, that the application they submitted was incomplete, since they were missing some documentation at the time of the appointment, Pt need to reschedule and resubmit all new papers and application for CAFA and OC, P.S. old documents has been sent back to Pt and need to make a new appt °

## 2019-07-22 ENCOUNTER — Telehealth: Payer: Self-pay | Admitting: Hematology and Oncology

## 2019-07-22 NOTE — Telephone Encounter (Signed)
A new hem appt has been scheduled for Anne Curtis for pt to see Dr Lorenso Courier on 11/11 at 2pm. Pt aware to arrive 15 minutes early

## 2019-07-27 ENCOUNTER — Encounter: Payer: Self-pay | Admitting: *Deleted

## 2019-07-27 ENCOUNTER — Other Ambulatory Visit: Payer: Self-pay

## 2019-07-27 ENCOUNTER — Inpatient Hospital Stay: Payer: Self-pay

## 2019-07-27 ENCOUNTER — Inpatient Hospital Stay: Payer: Self-pay | Attending: Hematology and Oncology | Admitting: Hematology and Oncology

## 2019-07-27 VITALS — BP 107/76 | HR 95 | Temp 98.3°F | Resp 18 | Ht 65.0 in

## 2019-07-27 DIAGNOSIS — D6859 Other primary thrombophilia: Secondary | ICD-10-CM | POA: Insufficient documentation

## 2019-07-27 DIAGNOSIS — O2691 Pregnancy related conditions, unspecified, first trimester: Secondary | ICD-10-CM | POA: Insufficient documentation

## 2019-07-27 DIAGNOSIS — E1151 Type 2 diabetes mellitus with diabetic peripheral angiopathy without gangrene: Secondary | ICD-10-CM | POA: Insufficient documentation

## 2019-07-27 DIAGNOSIS — D649 Anemia, unspecified: Secondary | ICD-10-CM | POA: Insufficient documentation

## 2019-07-27 LAB — CMP (CANCER CENTER ONLY)
ALT: 10 U/L (ref 0–44)
AST: 14 U/L — ABNORMAL LOW (ref 15–41)
Albumin: 3.4 g/dL — ABNORMAL LOW (ref 3.5–5.0)
Alkaline Phosphatase: 82 U/L (ref 38–126)
Anion gap: 10 (ref 5–15)
BUN: 9 mg/dL (ref 6–20)
CO2: 21 mmol/L — ABNORMAL LOW (ref 22–32)
Calcium: 9 mg/dL (ref 8.9–10.3)
Chloride: 106 mmol/L (ref 98–111)
Creatinine: 0.74 mg/dL (ref 0.44–1.00)
GFR, Est AFR Am: >60 mL/min (ref >60)
GFR, Estimated: >60 mL/min (ref >60)
Glucose, Bld: 87 mg/dL (ref 70–99)
Potassium: 4.1 mmol/L (ref 3.5–5.1)
Sodium: 137 mmol/L (ref 135–145)
Total Bilirubin: 0.3 mg/dL (ref 0.3–1.2)
Total Protein: 8.1 g/dL (ref 6.5–8.1)

## 2019-07-27 LAB — CBC WITH DIFFERENTIAL (CANCER CENTER ONLY)
Abs Immature Granulocytes: 0.04 10*3/uL (ref 0.00–0.07)
Basophils Absolute: 0.1 10*3/uL (ref 0.0–0.1)
Basophils Relative: 0 %
Eosinophils Absolute: 0.3 10*3/uL (ref 0.0–0.5)
Eosinophils Relative: 3 %
HCT: 37.4 % (ref 36.0–46.0)
Hemoglobin: 11.9 g/dL — ABNORMAL LOW (ref 12.0–15.0)
Immature Granulocytes: 0 %
Lymphocytes Relative: 23 %
Lymphs Abs: 2.8 10*3/uL (ref 0.7–4.0)
MCH: 27.2 pg (ref 26.0–34.0)
MCHC: 31.8 g/dL (ref 30.0–36.0)
MCV: 85.6 fL (ref 80.0–100.0)
Monocytes Absolute: 0.9 10*3/uL (ref 0.1–1.0)
Monocytes Relative: 8 %
Neutro Abs: 8 10*3/uL — ABNORMAL HIGH (ref 1.7–7.7)
Neutrophils Relative %: 66 %
Platelet Count: 434 10*3/uL — ABNORMAL HIGH (ref 150–400)
RBC: 4.37 MIL/uL (ref 3.87–5.11)
RDW: 15.2 % (ref 11.5–15.5)
WBC Count: 12 10*3/uL — ABNORMAL HIGH (ref 4.0–10.5)
nRBC: 0 % (ref 0.0–0.2)

## 2019-07-27 MED ORDER — ENOXAPARIN SODIUM 40 MG/0.4ML ~~LOC~~ SOLN: 40.0000 mg | SUBCUTANEOUS | 9 refills | Status: AC

## 2019-07-27 NOTE — Progress Notes (Signed)
[place] [phone]   Fax:([phone] [place] NOTE  Patient Care Team: Gildardo Pounds, NP as PCP - General (Nurse Practitioner)  Hematological/Oncological History  # Protein S Deficiency 1) 2008- reported history of Protein S deficiency (48%), diagnosed in [place]. 2) 07/04/2019: Hypercoagulation workup peformed. Protein S Total noted to be 63% (60-150%). Additional workup including APS antibodies, FVL, Antithrombin Levels, homocysteine, and Protein C activity is within normal limits 3) 07/27/2019: establish care with Dr. Lorenso Courier  CHIEF COMPLAINTS/PURPOSE OF CONSULTATION:  Borderline Protein S deficiency  HISTORY OF PRESENTING ILLNESS:  Anne Curtis 43 y.o. female with medical history significant for anemia and peripheral vascular disease presents for evaluation for history of Protein S deficiency.  On review of the patient's records, she was initially noted to have Protein S deficiency in Sept 2013. Dr. Christoper Fabian noted at the time " Pt is on lovenox, aspirin, Vitamin B6 for coagulation disorder (protein S deficiency, borderline ANA). Pt has no history of blood clots or pulmonary embolism and no family history of blood clots or bleeding disorders. Her doctor in [place] put her on Lovenox and aspirin in previous 2 pregnancies (after 2 miscarriages) due to a presumed coagulation disorder and work-up showing low Protein S function (48%). She does not really know why she is taking these medications but says it has to do with the baby's nutrition." She underwent delivery in Jan 2014. She was maintained on Lovenox until 6 weeks after the delivery of the female infant. To clarify, the above note states 2 miscarriages, but on further review according to the patient and records these were 2/3rd term loses thought be due to hydrocephalus.   On exam today [name] notes she feels well. She reports she was found to be pregnant last Friday with the assistance of IVF. She is  hoping today to receive a prescription for lovenox given she had received these during her last 2 pregnancies. She reports having been diagnosed in [place] with Protein S deficiency, but has no personal or family history of blood clots. While taking lovenox during her prior pregnancies she had no issues with bleeding or bruising. Today she notes no nosebleeds, dark stools, or serious bruising.   We discussed the nature of protein S disease and the risks and benefits of anticoagulation therapy. She was agreeable to pursue anticoagulation therapy, but did request financial assistance as the lovenox she previously took was expensive.  A 10 point ROS was listed below.   MEDICAL HISTORY:  Past Medical History:  Diagnosis Date  . Anemia   . Peripheral vascular disease ([place])    at risk for clot - on lovenox    SURGICAL HISTORY: Past Surgical History:  Procedure Laterality Date  . CESAREAN SECTION    . CESAREAN SECTION  09/16/2012   Procedure: CESAREAN SECTION;  Surgeon: Lavonia Drafts, MD;  Location: [facility];  Service: Obstetrics;  Laterality: N/A;    SOCIAL HISTORY: Social History   Socioeconomic History  . Marital status: Married    Spouse name: Not on file  . Number of children: Not on file  . Years of education: Not on file  . Highest education level: Not on file  Occupational History  . Not on file  Social Needs  . Financial resource strain: Not on file  . Food insecurity    Worry: Not on file    Inability: Not on file  . Transportation needs    Medical: Not on file  Non-medical: Not on file  Tobacco Use  . Smoking status: Never Smoker  . Smokeless tobacco: Never Used  Substance and Sexual Activity  . Alcohol use: No  . Drug use: No  . Sexual activity: Not Currently    Birth control/protection: None    Comment: pregnant  Lifestyle  . Physical activity    Days per week: Not on file    Minutes per session: Not on file  . Stress: Not on file  Relationships  .  Social Musician on phone: Not on file    Gets together: Not on file    Attends religious service: Not on file    Active member of club or organization: Not on file    Attends meetings of clubs or organizations: Not on file    Relationship status: Not on file  . Intimate partner violence    Fear of current or ex partner: Not on file    Emotionally abused: Not on file    Physically abused: Not on file    Forced sexual activity: Not on file  Other Topics Concern  . Not on file  Social History Narrative  . Not on file    FAMILY HISTORY: Family History  Problem Relation Age of Onset  . Diabetes Mother   . Diabetes Father     ALLERGIES:  has No Known Allergies.  MEDICATIONS:  Current Outpatient Medications  Medication Sig Dispense Refill  . levothyroxine (SYNTHROID, LEVOTHROID) 75 MCG tablet Take 1 tablet (75 mcg total) by mouth daily before breakfast. 90 tablet 1   No current facility-administered medications for this visit.     REVIEW OF SYSTEMS:   Constitutional: ( - ) fevers, ( - )  chills , ( - ) night sweats Eyes: ( - ) blurriness of vision, ( - ) double vision, ( - ) watery eyes Ears, nose, mouth, throat, and face: ( - ) mucositis, ( - ) sore throat Respiratory: ( - ) cough, ( - ) dyspnea, ( - ) wheezes Cardiovascular: ( - ) palpitation, ( - ) chest discomfort, ( - ) lower extremity swelling Gastrointestinal:  ( - ) nausea, ( - ) heartburn, ( - ) change in bowel habits Skin: ( - ) abnormal skin rashes Lymphatics: ( - ) new lymphadenopathy, ( - ) easy bruising Neurological: ( - ) numbness, ( - ) tingling, ( - ) new weaknesses Behavioral/Psych: ( - ) mood change, ( - ) new changes  All other systems were reviewed with the patient and are negative.  PHYSICAL EXAMINATION: ECOG PERFORMANCE STATUS: 0 - Asymptomatic  Vitals:   07/27/19 1430  BP: 107/76  Pulse: 95  Resp: 18  Temp: 98.3 F (36.8 C)  SpO2: 100%   There were no vitals filed for this  visit.  GENERAL: well appearing [place] female in NAD  SKIN: skin color, texture, turgor are normal, no rashes or significant lesions EYES: conjunctiva are pink and non-injected, sclera clear LUNGS: clear to auscultation and percussion with normal breathing effort HEART: regular rate & rhythm and no murmurs and no lower extremity edema ABDOMEN: soft, non-tender, non-distended, normal bowel sounds Musculoskeletal: no cyanosis of digits and no clubbing  PSYCH: alert & oriented x 3, fluent speech NEURO: no focal motor/sensory deficits  LABORATORY DATA:  I have reviewed the data as listed Lab Results  Component Value Date   WBC 12.0 (H) 07/27/2019   HGB 11.9 (L) 07/27/2019   HCT 37.4 07/27/2019  MCV 85.6 07/27/2019   PLT 434 (H) 07/27/2019   NEUTROABS 8.0 (H) 07/27/2019    PATHOLOGY: None to review.  RADIOGRAPHIC STUDIES: None to review  ASSESSMENT & PLAN Odetta PinkOraub Dieppa 43 y.o. female with medical history significant for anemia and peripheral vascular disease presents for evaluation for history of Protein S deficiency. This is a complicated case and history. She has lost 2 pregnancies in the 2/3rd term due to hydrocephalus, however after she received lovenox with her last 2 pregnancies she had normal healthy births. Protein S deficiency is associated with late term fetal loss (studies from outside the [place], listed below), but her protein S level is not below reference range (borderline), though it may drop due to dilution during pregnancy. Out of an abundance of caution and with the understanding that the benefits outweigh the risk, I will prescribe prophylaxis lovenox 40mg  subq daily for the duration of her pregnancy.   The patient has no personal or family history of VTE. Once she has delivered we can continue lovenox x 6 weeks post partum and then therapy can be discontinued. Of note, she has completed a full hypercoagulation workup including APS, Protein C, and homocysteine with  everything returning as negative.   #Borderline Protein S Deficiency --patient has no personal or family history of VTE, however she does have a personal history of 2 intrauterine fetal deaths. Prior hypercoagulation workup was negative, with recent levels tested on 07/01/19 show Protein S Total noted to be 63% (60-150%).  --the reason for her two prior lost pregnancies is unclear. Out of an abundance of caution and concern that a thrombophilia may be causing these pregnancy loses we will recommend administering 40mg  enoxaparin subq daily up to delivery, then 6 weeks after. The patient is at low bleeding risk and has tolerated anticoagulation in the past.  --no further hypercoagulation workup is required at this time, complete evaluation has been performed on 07/01/19.  --RTC in 3 months to assure she is tolerating lovenox. We have requested the financial assistance to help her afford the medication.   All questions were answered. The patient knows to call the clinic with any problems, questions or concerns.  A total of more than 60 minutes were spent face-to-face with the patient during this encounter and over half of that time was spent on counseling and coordination of care as outlined above.   Ulysees BarnsJohn T. Yuriy Cui, MD Department of Hematology/Oncology [facility] Phone: [phone] Pager: [phone] Email: [email or web address]  07/27/2019 4:46 PM   Literature Support:  Flonnie HailstoneShen, MC., Wonda OldsWu, WJ., Caralee Atesheng, PJ. et al. Low-molecular-weight-heparin can benefit women with recurrent pregnancy loss and sole protein S deficiency: a historical control cohort study from [place]. Thrombosis J 14, 44 (2016). AstronomyConvention.glhttps://doi.org/10.1186/s12959-(216) 610-5369-9 --Our pilot study in [place], an [place] population, indicated anti-coagulation therapy is of benefit to women with recurrent pregnancy loss who had documented sole protein S deficiency.  Lalan DM, [place] MJ, [place]. Successful pregnancy outcome in a case of protein s deficiency. J Obstet Gynaecol [place]. 2012;62(Suppl 1):21-22. doi:10.1007/s13224-954 478 8966-9 --subcutaneous unfractionated or low molecular weight heparins (LMWH) are the anticoagulants of choice. Heparin does not cross the placenta, and thus there is no risk of teratogenesis or fetal hemorrhage. LMWH is the drug of choice because of a better side-effect profile, (reduced risk of osteopenia and thrombocytopenia) good safety record for mother and fetus, and convenient once-daily dosing for prophylaxis.

## 2019-07-27 NOTE — Progress Notes (Signed)
Financial assistance form to [employer], for Enoxaparin, sent to patient for completion. Advised pt via letter to return the completed forms to the cancer center to be fax'd to the company.

## 2019-07-28 ENCOUNTER — Telehealth: Payer: Self-pay | Admitting: Hematology and Oncology

## 2019-07-28 NOTE — Telephone Encounter (Signed)
Called Anne Curtis to let her know that the lovenox prescription has been called into the Outpatient Pharmacy at [facility]. There was no answer, so a message was left.   The information and forms regarding financial assistance has been mailed to her as well. If she wishes to start the lovenox before that time the prescription is available for her at the pharmacy.  She was instructed to call with any questions or concerns.  Anne Peoples, MD Department of Hematology/Oncology [place] at [facility] Phone: [phone] Pager: [phone] Email: [email or web address]

## 2019-08-02 MED FILL — ENOXAPARIN SODIUM 40 MG/0.4: 40 | 10 days supply | Qty: 4 | Fill #0

## 2019-08-10 ENCOUNTER — Telehealth: Payer: Self-pay | Admitting: *Deleted

## 2019-08-10 ENCOUNTER — Encounter: Payer: Self-pay | Admitting: *Deleted

## 2019-08-10 NOTE — Telephone Encounter (Signed)
Received fax from [place] with denial of request for financial assistance for Lovenox as her diagnosis of Protein S Deficiency is not an FDA approved diagnosis for this assistance.  Letter with the above information was sent out to patient.  Dr. Lorenso Courier aware.

## 2019-08-19 MED FILL — ENOXAPARIN SODIUM 40 MG/0.4: 40 | 10 days supply | Qty: 4 | Fill #1

## 2019-08-31 ENCOUNTER — Telehealth: Payer: Self-pay | Admitting: *Deleted

## 2019-08-31 ENCOUNTER — Inpatient Hospital Stay (HOSPITAL_COMMUNITY): Payer: Self-pay

## 2019-08-31 ENCOUNTER — Other Ambulatory Visit: Payer: Self-pay

## 2019-08-31 ENCOUNTER — Inpatient Hospital Stay (HOSPITAL_COMMUNITY)
Admission: AD | Admit: 2019-08-31 | Discharge: 2019-08-31 | Disposition: A | Discharge status: Home / Self Care | Payer: Self-pay | Attending: Obstetrics & Gynecology | Admitting: Obstetrics & Gynecology

## 2019-08-31 ENCOUNTER — Encounter (HOSPITAL_COMMUNITY): Payer: Self-pay | Admitting: Obstetrics & Gynecology

## 2019-08-31 DIAGNOSIS — O209 Hemorrhage in early pregnancy, unspecified: Secondary | ICD-10-CM | POA: Insufficient documentation

## 2019-08-31 DIAGNOSIS — Z833 Family history of diabetes mellitus: Secondary | ICD-10-CM | POA: Insufficient documentation

## 2019-08-31 DIAGNOSIS — O200 Threatened abortion: Secondary | ICD-10-CM | POA: Insufficient documentation

## 2019-08-31 DIAGNOSIS — Z3A09 9 weeks gestation of pregnancy: Secondary | ICD-10-CM | POA: Insufficient documentation

## 2019-08-31 DIAGNOSIS — Z7901 Long term (current) use of anticoagulants: Secondary | ICD-10-CM | POA: Insufficient documentation

## 2019-08-31 DIAGNOSIS — Z7982 Long term (current) use of aspirin: Secondary | ICD-10-CM | POA: Insufficient documentation

## 2019-08-31 DIAGNOSIS — I739 Peripheral vascular disease, unspecified: Secondary | ICD-10-CM | POA: Insufficient documentation

## 2019-08-31 DIAGNOSIS — O34219 Maternal care for unspecified type scar from previous cesarean delivery: Secondary | ICD-10-CM | POA: Insufficient documentation

## 2019-08-31 DIAGNOSIS — O09522 Supervision of elderly multigravida, second trimester: Secondary | ICD-10-CM | POA: Insufficient documentation

## 2019-08-31 DIAGNOSIS — Z79890 Hormone replacement therapy (postmenopausal): Secondary | ICD-10-CM | POA: Insufficient documentation

## 2019-08-31 DIAGNOSIS — O99411 Diseases of the circulatory system complicating pregnancy, first trimester: Secondary | ICD-10-CM | POA: Insufficient documentation

## 2019-08-31 HISTORY — DX: Other primary thrombophilia: D68.59

## 2019-08-31 LAB — URINALYSIS, ROUTINE W REFLEX MICROSCOPIC
Bilirubin Urine: NEGATIVE
Glucose, UA: NEGATIVE mg/dL
Ketones, ur: NEGATIVE mg/dL
Leukocytes,Ua: NEGATIVE
Nitrite: NEGATIVE
Protein, ur: 30 mg/dL — AB
Specific Gravity, Urine: 1.004 — ABNORMAL LOW (ref 1.005–1.030)
pH: 6 (ref 5.0–8.0)

## 2019-08-31 LAB — POCT PREGNANCY, URINE: Preg Test, Ur: POSITIVE — AB

## 2019-08-31 NOTE — Discharge Instructions (Signed)
________________________________________     To schedule your Maternity Eligibility Appointment, please call [phone].  When you arrive for your appointment you must bring the following items or information listed below.  Your appointment will be rescheduled if you do not have these items or are 15 minutes late. If currently receiving Medicaid, you MUST bring: 1. Medicaid Card 2. Social Security Card 3. Picture ID 4. Proof of Pregnancy 5. Verification of current address if the address on Medicaid card is incorrect "postmarked mail" If not receiving Medicaid, you MUST bring: 1. Social Security Card 2. Picture ID 3. Birth Certificate (if available) Passport or *Green Card 4. Proof of Pregnancy 5. Verification of current address "postmarked mail" for each income presented. 6. Verification of insurance coverage, if any 7. Check stubs from each employer for the previous month (if unable to present check stub  for each week, we will accept check stub for the first and last week ill the same month.) If you can't locate check stubs, you must bring a letter from the employer(s) and it must have the following information on letterhead, typed, in English: o name of [place] telephone number o how long been with the company, if less than one month o how much person earns per hour o how many hours per week work o the gross pay the person earned for the previous month If you are 43 years old or less, you do not have to bring proof of income unless you work or live with the father of the baby and at that time we will need proof of income from you and/or the father of the baby. Green Card recipients are eligible for Medicaid for Pregnant Women (MPW)      Threatened Miscarriage  A threatened miscarriage occurs when a woman has vaginal bleeding during the first 20 weeks of pregnancy but the pregnancy has not ended. If you have vaginal bleeding during this time, your health care  provider will do tests to make sure you are still pregnant. If the tests show that you are still pregnant and that the developing baby (fetus) inside your uterus is still growing, your condition is considered a threatened miscarriage. A threatened miscarriage does not mean your pregnancy will end, but it does increase the risk of losing your pregnancy (complete miscarriage). What are the causes? The cause of this condition is usually not known. For women who go on to have a complete miscarriage, the most common cause is an abnormal number of chromosomes in the developing baby. Chromosomes are the structures inside cells that hold all of a person's genetic material. What increases the risk? The following lifestyle factors may increase your risk of a miscarriage in early pregnancy:  Smoking.  Drinking excessive amounts of alcohol or caffeine.  Recreational drug use. The following preexisting health conditions may increase your risk of a miscarriage in early pregnancy:  Polycystic ovary syndrome.  Uterine fibroids.  Infections.  Diabetes mellitus. What are the signs or symptoms? Symptoms of this condition include:  Vaginal bleeding.  Mild abdominal pain or cramps. How is this diagnosed? If you have bleeding with or without abdominal pain before 20 weeks of pregnancy, your health care provider will do tests to check whether you are still pregnant. These will include:  Ultrasound. This test uses sound waves to create images of the inside of your uterus. This allows your health care provider to look at your developing baby and other structures, such as your placenta.  Pelvic exam. This is an internal exam of your vagina and cervix.  Measurement of your baby's heart rate.  Laboratory tests such as blood tests, urine tests, or swabs for infection You may be diagnosed with a threatened miscarriage if:  Ultrasound testing shows that you are still pregnant.  Your babys heart rate is  strong.  A pelvic exam shows that the opening between your uterus and your vagina (cervix) is closed.  Blood tests confirm that you are still pregnant. How is this treated? No treatments have been shown to prevent a threatened miscarriage from going on to a complete miscarriage. However, the right home care is important. Follow these instructions at home:  Get plenty of rest.  Do not have sex or use tampons if you have vaginal bleeding.  Do not douche.  Do not smoke or use recreational drugs.  Do not drink alcohol.  Avoid caffeine.  Keep all follow-up prenatal visits as told by your health care provider. This is important. Contact a health care provider if:  You have light vaginal bleeding or spotting while pregnant.  You have abdominal pain or cramping.  You have a fever. Get help right away if:  You have heavy vaginal bleeding.  You have blood clots coming from your vagina.  You pass tissue from your vagina.  You leak fluid, or you have a gush of fluid from your vagina.  You have severe low back pain or abdominal cramps.  You have fever, chills, and severe abdominal pain. Summary  A threatened miscarriage occurs when a woman has vaginal bleeding during the first 20 weeks of pregnancy but the pregnancy has not ended.  The cause of a threatened miscarriage is usually not known.  Symptoms of this condition may include vaginal bleeding and mild abdominal pain or cramps.  No treatments have been shown to prevent a threatened miscarriage from going on to a complete miscarriage.  Keep all follow-up prenatal visits as told by your health care provider. This is important. This information is not intended to replace advice given to you by your health care provider. Make sure you discuss any questions you have with your health care provider. Document Released: 09/01/2005 Document Revised: 10/08/2017 Document Reviewed: 11/28/2016 Elsevier Patient Education  2020 [employer].

## 2019-08-31 NOTE — Telephone Encounter (Signed)
Received vm message from pt stating that she was having some bleeding and wanted to know if she should continue her lovenox.  She did not say where the bleeding was coming from, though she is pregnant and is on the Lovenox for'Protein S Deficiency'. Attempted several calls to patient with no answer. Left vm message for pt to call me back.

## 2019-08-31 NOTE — MAU Provider Note (Signed)
Chief Complaint: Vaginal Bleeding   First Provider Initiated Contact with Patient 08/31/19 1434     SUBJECTIVE HPI: Anne Curtis is a 43 y.o. [id number] at [redacted]w[redacted]d who presents to Maternity Admissions reporting vaginal bleeding. Pregnancy is result of IVF. Had an ultrasound at Dr. Lyndal RainbowYalcinkaya's office on Monday that was normal.  Reports bleeding since Monday. Initially was brown spotting but has turned darker red and is having to wear a pad. Not saturating pads. Passed one small clot this morning. Has irregular mild abdominal cramping. No recent intercourse. Hx of SABs. Protein S deficiency, on lovenox.  Currently no pain.  Unsure where she will go for prenatal care [place] vs Dr. Billy Coastaavon.    Past Medical History:  Diagnosis Date  . Anemia   . Peripheral vascular disease (HCC)    at risk for clot - on lovenox  . Protein S deficiency (HCC)    OB History  Gravida Para Term Preterm AB Living  7 3 2 1 3 2   SAB TAB Ectopic Multiple Live Births  3       2    # Outcome Date GA Lbr Len/2nd Weight Sex Delivery Anes PTL Lv  7 Current           6 Term 09/16/12 5939w1d  3135 g M CS-LTranv Spinal  LIV  5 SAB 2012 8822w0d            Birth Comments: D and C  4 Term 07/10/08 8839w0d   F CS-LTranv None N LIV  3 Preterm 2008 6339w0d    Vag-Spont   FD     Birth Comments: Hydrocephalus  2 SAB 2008 5650w0d         1 SAB 2006 9470w0d          Past Surgical History:  Procedure Laterality Date  . CESAREAN SECTION  2009  . CESAREAN SECTION  09/16/2012   Procedure: CESAREAN SECTION;  Surgeon: Willodean Rosenthalarolyn Harraway-Smith, MD;  Location: [facility];  Service: Obstetrics;  Laterality: N/A;   Social History   Socioeconomic History  . Marital status: Married    Spouse name: Not on file  . Number of children: Not on file  . Years of education: Not on file  . Highest education level: Not on file  Occupational History  . Not on file  Tobacco Use  . Smoking status: Never Smoker  . Smokeless tobacco: Never Used  Substance and Sexual  Activity  . Alcohol use: No  . Drug use: No  . Sexual activity: Not Currently    Birth control/protection: None    Comment: pregnant  Other Topics Concern  . Not on file  Social History Narrative  . Not on file   Social Determinants of Health   Financial Resource Strain:   . Difficulty of Paying Living Expenses: Not on file  Food Insecurity:   . Worried About Programme researcher, broadcasting/film/videounning Out of Food in the Last Year: Not on file  . Ran Out of Food in the Last Year: Not on file  Transportation Needs:   . Lack of Transportation (Medical): Not on file  . Lack of Transportation (Non-Medical): Not on file  Physical Activity:   . Days of Exercise per Week: Not on file  . Minutes of Exercise per Session: Not on file  Stress:   . Feeling of Stress : Not on file  Social Connections:   . Frequency of Communication with Friends and Family: Not on file  . Frequency of Social Gatherings with Friends  and Family: Not on file  . Attends Religious Services: Not on file  . Active Member of Clubs or Organizations: Not on file  . Attends Archivist Meetings: Not on file  . Marital Status: Not on file  Intimate Partner Violence:   . Fear of Current or Ex-Partner: Not on file  . Emotionally Abused: Not on file  . Physically Abused: Not on file  . Sexually Abused: Not on file   Family History  Problem Relation Age of Onset  . Diabetes Mother   . Diabetes Father    No current facility-administered medications on file prior to encounter.   Current Outpatient Medications on File Prior to Encounter  Medication Sig Dispense Refill  . aspirin EC 81 MG tablet Take 81 mg by mouth daily.    . Prenatal Vit-Fe Fumarate-FA (MULTIVITAMIN-PRENATAL) 27-0.8 MG TABS tablet Take 1 tablet by mouth daily at 12 noon.    . enoxaparin (LOVENOX) 40 MG/0.4ML injection Inject 0.4 mLs (40 mg total) into the skin daily for 300 doses. 12 mL 9  . levothyroxine (SYNTHROID, LEVOTHROID) 75 MCG tablet Take 1 tablet (75 mcg total) by  mouth daily before breakfast. 90 tablet 1   No Known Allergies  I have reviewed patient's Past Medical Hx, Surgical Hx, Family Hx, Social Hx, medications and allergies.   Review of Systems  Gastrointestinal: Positive for abdominal pain (not currently). Negative for constipation, diarrhea, nausea and vomiting.  Genitourinary: Positive for vaginal bleeding.    OBJECTIVE Patient Vitals for the past 24 hrs:  BP Temp Temp src Pulse Resp SpO2 Weight  08/31/19 1437 117/67 -- -- 72 -- -- --  08/31/19 1408 (!) 102/59 98.5 F (36.9 C) Oral 85 17 100 % 81.4 kg   Constitutional: Well-developed, well-nourished female in no acute distress.  Cardiovascular: normal rate & rhythm, no murmur Respiratory: normal rate and effort. Lung sounds clear throughout GI: Abd soft, non-tender, Pos BS x 4. No guarding or rebound tenderness MS: Extremities nontender, no edema, normal ROM Neurologic: Alert and oriented x 4.  GU:   Spec exam: small amount of dark red blood; unable to visualize cervix, pt didn't tolerate exam very well  LAB RESULTS Results for orders placed or performed during the hospital encounter of 08/31/19 (from the past 24 hour(s))  Pregnancy, urine POC     Status: Abnormal   Collection Time: 08/31/19  2:17 PM  Result Value Ref Range   Preg Test, Ur POSITIVE (A) NEGATIVE  Urinalysis, Routine w reflex microscopic     Status: Abnormal   Collection Time: 08/31/19  2:22 PM  Result Value Ref Range   Color, Urine YELLOW YELLOW   APPearance HAZY (A) CLEAR   Specific Gravity, Urine 1.004 (L) 1.005 - 1.030   pH 6.0 5.0 - 8.0   Glucose, UA NEGATIVE NEGATIVE mg/dL   Hgb urine dipstick LARGE (A) NEGATIVE   Bilirubin Urine NEGATIVE NEGATIVE   Ketones, ur NEGATIVE NEGATIVE mg/dL   Protein, ur 30 (A) NEGATIVE mg/dL   Nitrite NEGATIVE NEGATIVE   Leukocytes,Ua NEGATIVE NEGATIVE   RBC / HPF 11-20 0 - 5 RBC/hpf   WBC, UA 0-5 0 - 5 WBC/hpf   Bacteria, UA FEW (A) NONE SEEN   Squamous Epithelial / LPF  0-5 0 - 5    IMAGING US OB Comp Less 14 Wks  Result Date: 08/31/2019 CLINICAL DATA:  Vaginal bleeding in first trimester of pregnancy increased since Saturday EXAM: OBSTETRIC <14 WK ULTRASOUND TECHNIQUE: Transabdominal ultrasound was performed  for evaluation of the gestation as well as the maternal uterus and adnexal regions. COMPARISON:  None. FINDINGS: Intrauterine gestational sac: Present, single Yolk sac:  Not visualized Embryo:  Present Cardiac Activity: Present Heart Rate: 178 bpm CRL:   24.3 mm   9 w 1 d                  [place] EDC: 04/03/2020 Subchorionic hemorrhage:  None visualized. Maternal uterus/adnexae: RIGHT ovary normal size and morphology 2.3 x 1.5 x 1.8 cm. LEFT ovary normal size and morphology 2.2 x 2.3 x 1.9 cm. No free pelvic fluid or adnexal masses. IMPRESSION: Single live intrauterine gestation at 9 weeks 1 day EGA by crown-rump length. No acute abnormalities. Electronically Signed   By: Ulyses Southward M.D.   On: 08/31/2019 15:49    MAU COURSE Orders Placed This Encounter  Procedures  . US OB Comp Less 14 Wks  . Urinalysis, Routine w reflex microscopic  . Pregnancy, urine POC   No orders of the defined types were placed in this encounter.   MDM RH positive  Small amount of blood on exam. Unable to check or visualize cervix.  Normal ultrasound, shows live IUP  Pt would like to return to [facility] for prenatal care  ASSESSMENT 1. Threatened miscarriage   2. [redacted] weeks gestation of pregnancy   3. Vaginal bleeding in pregnancy, first trimester     PLAN Discharge home in stable condition. Bleeding/SAB precautions Pelvic rest Msg to [facility] to start prenatal care - high risk pregnancy  Follow-up Information    [facility] Follow up.   Specialty: Obstetrics and Gynecology Why: return for worsening symptoms Contact information: [address] [id number] [place] [address] [phone]         Allergies as of  08/31/2019   No Known Allergies     Medication List    TAKE these medications   aspirin EC 81 MG tablet Take 81 mg by mouth daily.   enoxaparin 40 MG/0.4ML injection Commonly known as: LOVENOX Inject 0.4 mLs (40 mg total) into the skin daily for 300 doses.   levothyroxine 75 MCG tablet Commonly known as: SYNTHROID Take 1 tablet (75 mcg total) by mouth daily before breakfast.   multivitamin-prenatal 27-0.8 MG Tabs tablet Take 1 tablet by mouth daily at 12 noon.        Judeth Horn, NP 08/31/2019  4:36 PM

## 2019-08-31 NOTE — MAU Note (Signed)
Is preg. On Sat, noted some brown things.  On Mon afternoon, noted some blood, has increased since then. Is concerned, this is an IVF pregnancy. No pain now.

## 2019-09-01 MED FILL — ENOXAPARIN SODIUM 40 MG/0.4: 40 | 10 days supply | Qty: 4 | Fill #2

## 2019-09-02 ENCOUNTER — Inpatient Hospital Stay (HOSPITAL_COMMUNITY)
Admission: AD | Admit: 2019-09-02 | Discharge: 2019-09-02 | Disposition: A | Discharge status: Home / Self Care | Payer: Self-pay | Attending: Obstetrics & Gynecology | Admitting: Obstetrics & Gynecology

## 2019-09-02 ENCOUNTER — Encounter (HOSPITAL_COMMUNITY): Payer: Self-pay | Admitting: Obstetrics and Gynecology

## 2019-09-02 ENCOUNTER — Inpatient Hospital Stay (HOSPITAL_COMMUNITY): Payer: Self-pay

## 2019-09-02 ENCOUNTER — Other Ambulatory Visit: Payer: Self-pay

## 2019-09-02 DIAGNOSIS — O021 Missed abortion: Secondary | ICD-10-CM | POA: Insufficient documentation

## 2019-09-02 DIAGNOSIS — Z7901 Long term (current) use of anticoagulants: Secondary | ICD-10-CM | POA: Insufficient documentation

## 2019-09-02 DIAGNOSIS — Z3A09 9 weeks gestation of pregnancy: Secondary | ICD-10-CM

## 2019-09-02 DIAGNOSIS — O034 Incomplete spontaneous abortion without complication: Secondary | ICD-10-CM

## 2019-09-02 DIAGNOSIS — O34219 Maternal care for unspecified type scar from previous cesarean delivery: Secondary | ICD-10-CM | POA: Insufficient documentation

## 2019-09-02 DIAGNOSIS — Z6710 Type A blood, Rh positive: Secondary | ICD-10-CM | POA: Insufficient documentation

## 2019-09-02 DIAGNOSIS — Z833 Family history of diabetes mellitus: Secondary | ICD-10-CM | POA: Insufficient documentation

## 2019-09-02 DIAGNOSIS — O26891 Other specified pregnancy related conditions, first trimester: Secondary | ICD-10-CM | POA: Insufficient documentation

## 2019-09-02 DIAGNOSIS — O99411 Diseases of the circulatory system complicating pregnancy, first trimester: Secondary | ICD-10-CM | POA: Insufficient documentation

## 2019-09-02 DIAGNOSIS — O209 Hemorrhage in early pregnancy, unspecified: Secondary | ICD-10-CM

## 2019-09-02 DIAGNOSIS — I739 Peripheral vascular disease, unspecified: Secondary | ICD-10-CM | POA: Insufficient documentation

## 2019-09-02 MED ORDER — OXYCODONE-ACETAMINOPHEN 5-325 MG PO TABS: 1.0000 | ORAL_TABLET | Freq: Four times a day (QID) | ORAL | 0 refills | Status: AC | PRN

## 2019-09-02 MED ORDER — PROMETHAZINE HCL 25 MG PO TABS: 25.0000 mg | ORAL_TABLET | Freq: Four times a day (QID) | ORAL | 0 refills | Status: AC | PRN

## 2019-09-02 MED ORDER — MISOPROSTOL 100 MCG PO TABS: ORAL_TABLET | ORAL | 0 refills | Status: AC

## 2019-09-02 MED ORDER — IBUPROFEN 600 MG PO TABS: 600.0000 mg | ORAL_TABLET | Freq: Four times a day (QID) | ORAL | 0 refills | Status: AC | PRN

## 2019-09-02 NOTE — MAU Provider Note (Signed)
History     CSN: [id number]  Arrival date and time: 09/02/19 1434   First Provider Initiated Contact with Patient 09/02/19 1539      Chief Complaint  Patient presents with  . Vaginal Bleeding  . Abdominal Pain   HPI Anne Curtis 43 y.o. [redacted]w[redacted]d  Comes to MAU today with vaginal bleeding that contines since Monday and is worse.  Has not yet seen Dr. Ronita Hipps but plans to go after January 1 when her insurance is active.  Will go on service of [facility] as she is currently not established for prenatal care.  Was seen in MAU on 08-31-19 and ultrasound showed living fetus.  Also saw Dr. Kerin Perna on 08-29-19.  But client states bleeding has continued and is cramping.  She suspects that the baby may not be living.  Note from 08-31-19 reviewed.  OB History    Gravida  7   Para  3   Term  2   Preterm  1   AB  3   Living  2     SAB  3   TAB      Ectopic      Multiple      Live Births  2           Past Medical History:  Diagnosis Date  . Anemia   . Peripheral vascular disease ([place])    at risk for clot - on lovenox  . Protein S deficiency [facility])     Past Surgical History:  Procedure Laterality Date  . CESAREAN SECTION  2009  . CESAREAN SECTION  09/16/2012   Procedure: CESAREAN SECTION;  Surgeon: Lavonia Drafts, MD;  Location: [facility];  Service: Obstetrics;  Laterality: N/A;    Family History  Problem Relation Age of Onset  . Diabetes Mother   . Diabetes Father     Social History   Tobacco Use  . Smoking status: Never Smoker  . Smokeless tobacco: Never Used  Substance Use Topics  . Alcohol use: No  . Drug use: No    Allergies: No Known Allergies  Medications Prior to Admission  Medication Sig Dispense Refill Last Dose  . aspirin EC 81 MG tablet Take 81 mg by mouth daily.   Past Month at Unknown time  . enoxaparin (LOVENOX) 40 MG/0.4ML injection Inject 0.4 mLs (40 mg total) into the skin daily for 300 doses. 12 mL 9 09/02/2019 at 1200  . folic acid  (FOLVITE) 1 MG tablet Take 1 mg by mouth daily.   09/02/2019 at Unknown time  . levothyroxine (SYNTHROID, LEVOTHROID) 75 MCG tablet Take 1 tablet (75 mcg total) by mouth daily before breakfast. 90 tablet 1 09/02/2019  . Prenatal Vit-Fe Fumarate-FA (MULTIVITAMIN-PRENATAL) 27-0.8 MG TABS tablet Take 1 tablet by mouth daily at 12 noon.   09/02/2019 at Unknown time    Review of Systems  Constitutional: Negative for fever.  Respiratory: Negative for cough and shortness of breath.   Gastrointestinal: Positive for abdominal pain. Negative for nausea and vomiting.  Genitourinary: Positive for vaginal bleeding. Negative for dysuria and vaginal discharge.  Musculoskeletal: Positive for back pain.   Physical Exam   Blood pressure 112/60, pulse 90, temperature 98.3 F (36.8 C), temperature source Oral, resp. rate 16, height 5\' 5"  (1.651 m), weight 81.2 kg, SpO2 100 %, currently breastfeeding.  Physical Exam  Nursing note and vitals reviewed. Constitutional: She is oriented to person, place, and time. She appears well-developed and well-nourished.  HENT:  Head: Normocephalic.  Eyes:  EOM are normal.  GI: Soft. There is no abdominal tenderness. There is no rebound and no guarding.  Genitourinary:    Genitourinary Comments: Advised patient that speculum exam is needed but client declines stating she is bleeding and is in pain.  Bleeding is not more than usual menses.  Wearing pad.   Musculoskeletal:        General: Normal range of motion.     Cervical back: Neck supple.  Neurological: She is alert and oriented to person, place, and time.  Skin: Skin is warm and dry.  Psychiatric: She has a normal mood and affect.    MAU Course  Procedures Imaging CLINICAL DATA:  43 year old pregnant female with vaginal bleeding and pelvic pain. Estimated gestational age [redacted] weeks 3 days by LMP.  EXAM: OBSTETRIC <14 WK ULTRASOUND  TECHNIQUE: Transabdominal ultrasound was performed for evaluation of  the gestation as well as the maternal uterus and adnexal regions.  COMPARISON:  08/31/2019  FINDINGS: Intrauterine gestational sac: Single  Yolk sac:  Not Visualized.  Embryo:  Visualized.  Cardiac Activity: Not Visualized.  Heart Rate: 0 bpm  CRL:   31.3 mm   10 w 4 d  Subchorionic hemorrhage: A small to moderate subchorionic hemorrhage is noted.  Maternal uterus/adnexae: No significant abnormality. No free fluid or adnexal mass.  IMPRESSION: 1. Single intrauterine gestation, now without fetal heart tones since 08/31/2019, compatible with fetal demise. Consider follow-up as indicated. 2. Small to moderate subchorionic hemorrhage.   MDM Discussed ultrasound results with client - fetal demise.  Client wants D&E tonight and requested that MD be consulted and ask for procedure today.  States she has had 2 D&E procedures before even earlier in pregnancy in her home [place].  Dr. Despina Hidden consulted.  Client will need to be seen in the office on Monday and evaluated for D&E.  Message sent to the office and client informed. Discussed using cytotec along gumline to assist in the body's process for miscarriage.  Client reluctant but may decide to try cytotec.  Sent to her pharmacy along with phenergan, ibuprofen, and percocet.  Assessment and Plan  Vaginal bleeding Fetal demise with impending miscarriage at [redacted]w[redacted]d by dating and 10w 4d size by [place] Client desires D&E - will be scheduled next week through the office. Blood type A positive  Plan Message sent to clinic to schedule to see MD Medications sent to her pharmacy in case client decides to try cytotec. Continue current medications low dose aspirin and lovenox per Dr. Despina Hidden Discussed Covid testing but will not be having surgery within the 72 hour window recommended for Covid tests - will not perform today. All client's questions answered.  Currie Paris 09/02/2019, 5:33 PM

## 2019-09-02 NOTE — MAU Note (Signed)
Pt states she was here on Wednesday for vaginal bleeding. Pt states bleeding had decreased on Thursday night. Pt states she started to have increased vaginal bleeding and lower abdominal cramping that started around 10:30 this morning. Pt rates pain at a 6 on a 0-10 scale.

## 2019-09-02 NOTE — Discharge Instructions (Signed)
The office will call you on Monday to work on scheduling an appointment. Your medications are at the pharmacy.  Medications are for assisting your body to complete the miscarriage, for pain - Percocet for severe pain and ibuprofen.  You can take these pain medications together.  And there is medication for nausea.

## 2019-09-05 ENCOUNTER — Ambulatory Visit: Payer: Self-pay | Admitting: Obstetrics and Gynecology

## 2019-09-05 ENCOUNTER — Telehealth: Payer: Self-pay

## 2019-09-05 ENCOUNTER — Encounter: Payer: Self-pay | Admitting: Obstetrics and Gynecology

## 2019-09-05 NOTE — Telephone Encounter (Signed)
Called pt per Dr. Rosana Hoes since she missed her appt. Today to see if she took the Cytotec & does she need to reschedule appt. Used Airline pilot, no answer left message for call back.

## 2019-09-15 ENCOUNTER — Telehealth: Payer: Self-pay | Admitting: Hematology and Oncology

## 2019-09-15 NOTE — Telephone Encounter (Signed)
Scheduled per 11/11 los. Called and left msg. Mailing printout 

## 2019-09-21 ENCOUNTER — Encounter: Payer: Self-pay | Admitting: Obstetrics and Gynecology

## 2019-10-27 ENCOUNTER — Other Ambulatory Visit: Payer: Self-pay

## 2019-10-27 ENCOUNTER — Inpatient Hospital Stay: Payer: Self-pay | Attending: Hematology and Oncology | Admitting: Hematology and Oncology

## 2019-10-27 ENCOUNTER — Inpatient Hospital Stay: Payer: Self-pay

## 2020-10-04 IMAGING — US US OB COMP LESS 14 WK
1 series · 15 of 19 positions shown · non-contrast
Comparison: None.

CLINICAL DATA: Vaginal bleeding in first trimester of pregnancy
increased since [REDACTED]

EXAM:
OBSTETRIC <14 WK ULTRASOUND
TECHNIQUE: Transabdominal ultrasound was performed for evaluation of the
gestation as well as the maternal uterus and adnexal regions.

[Series 1: us ob comp less 14 wk · 19 acquisitions, 15 frames shown]
[im 1/19]
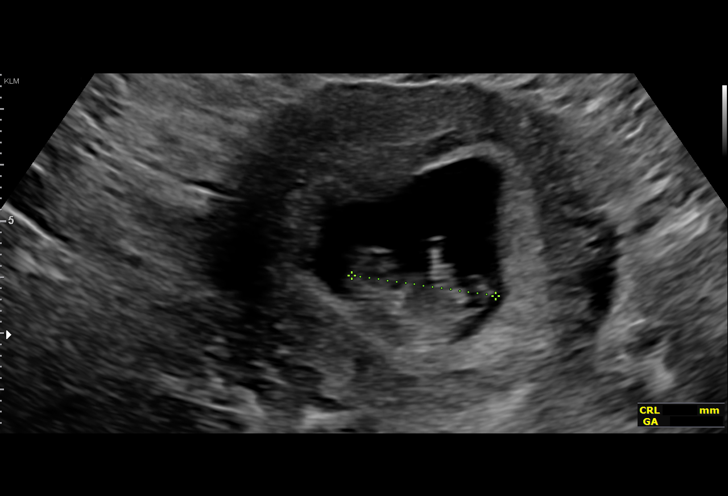
[im 2/19]
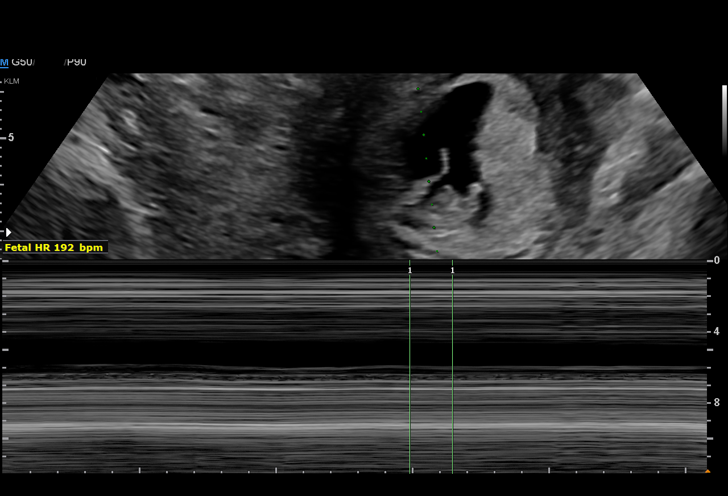
[im 4/19]
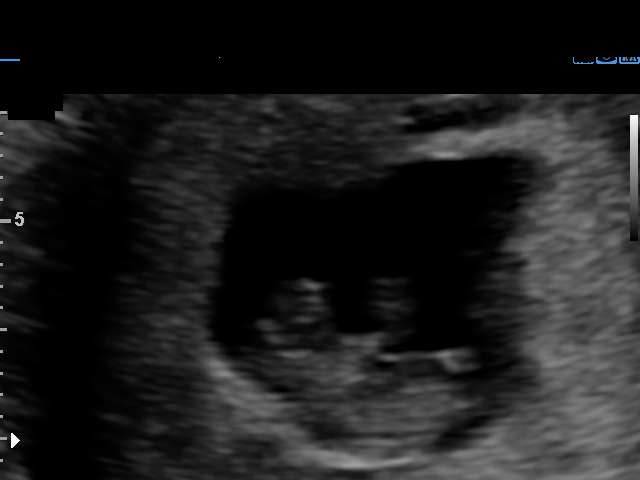
[im 5/19]
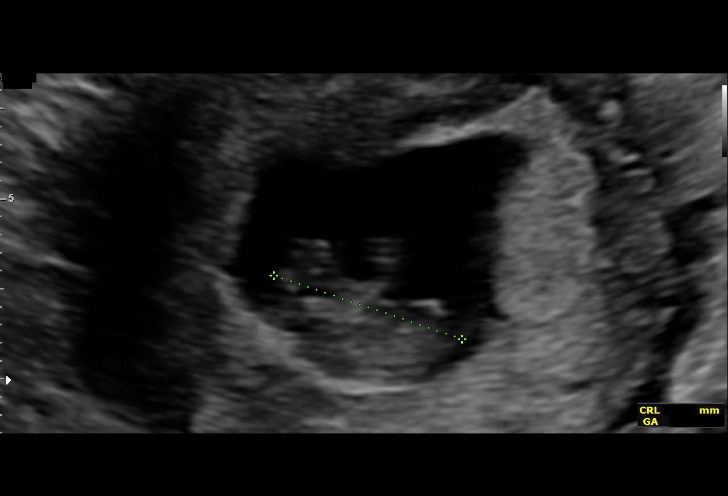
[im 6/19]
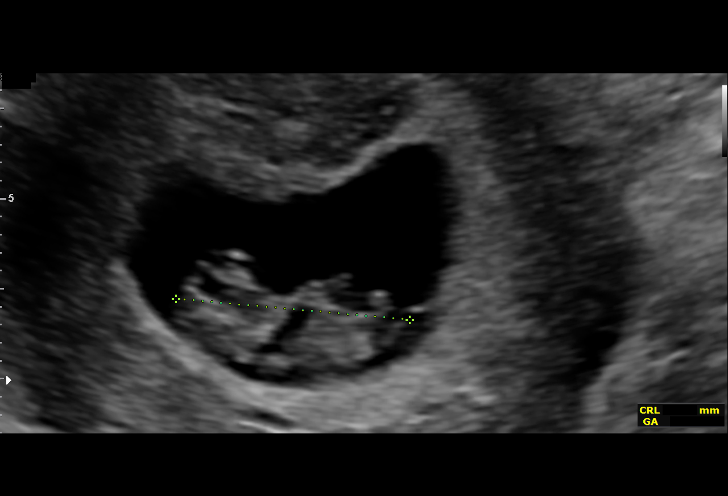
[im 7/19]
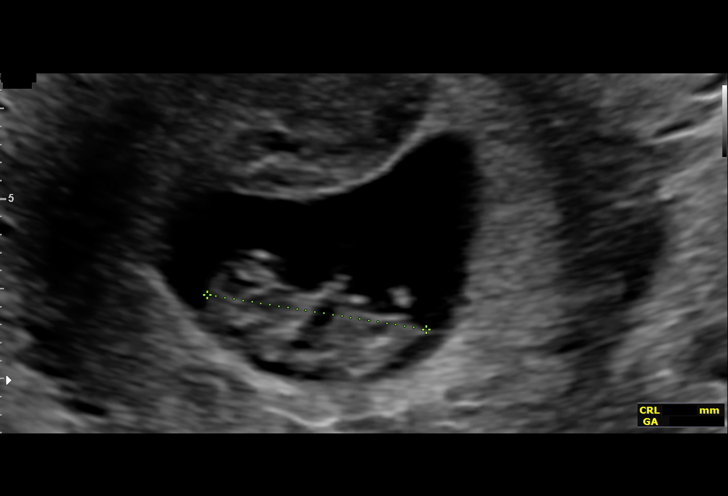
[im 9/19]
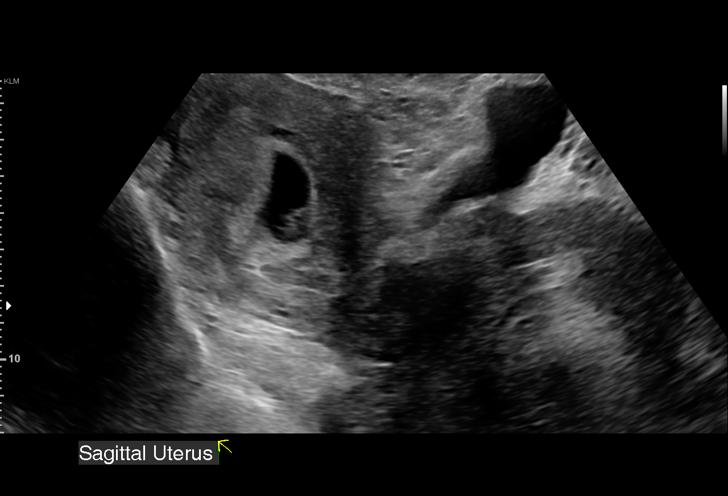
[im 10/19]
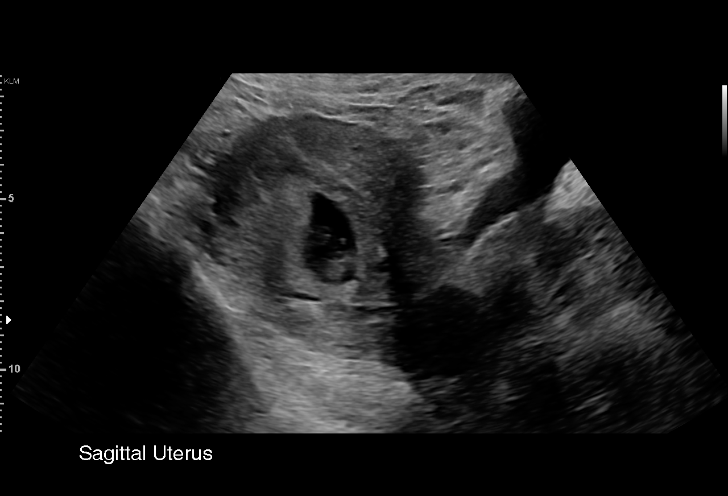
[im 11/19]
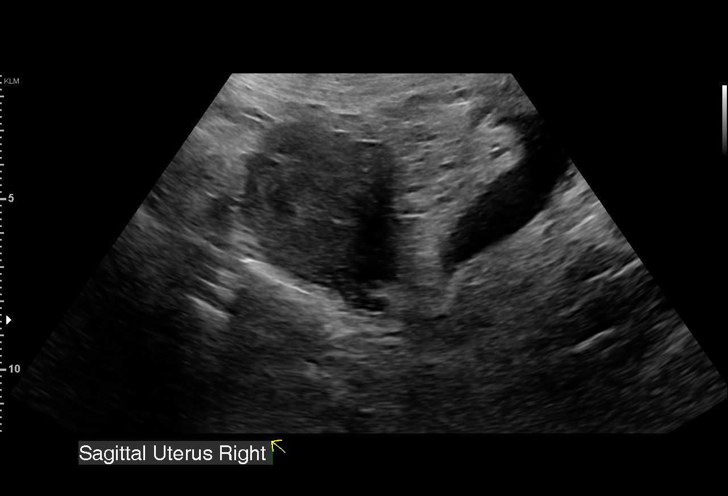
[im 13/19]
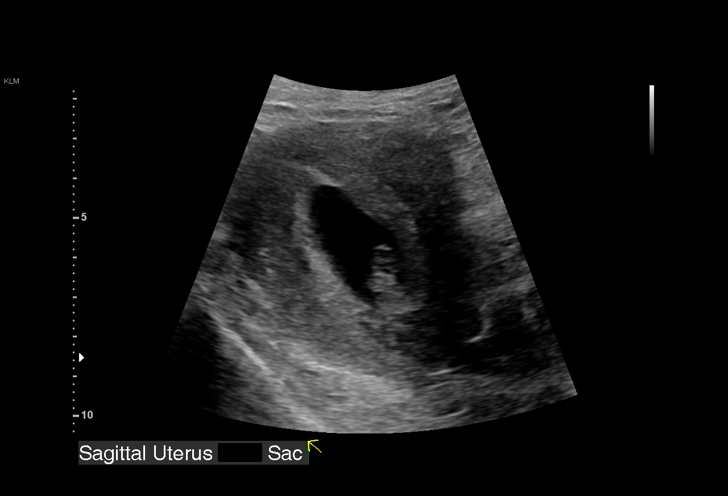
[im 14/19]
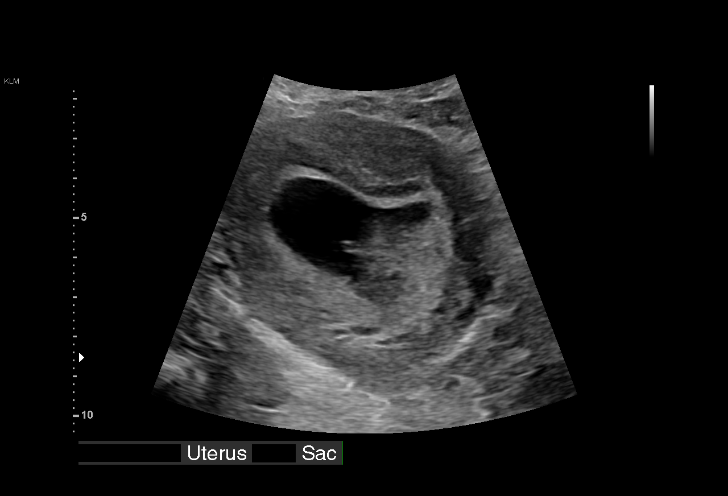
[im 15/19]
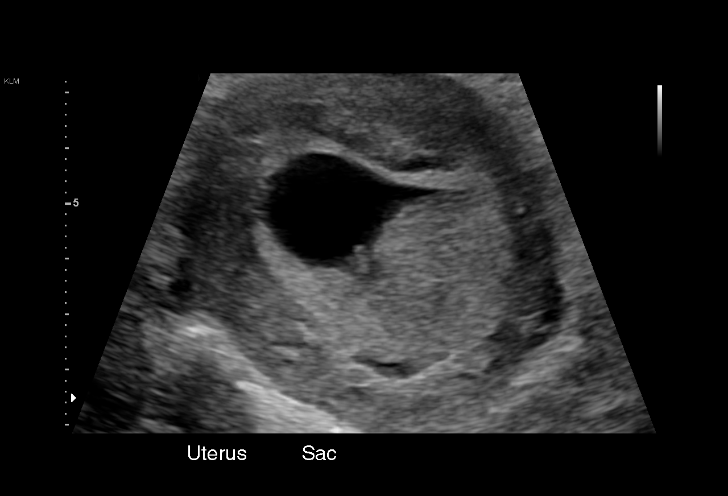
[im 16/19]
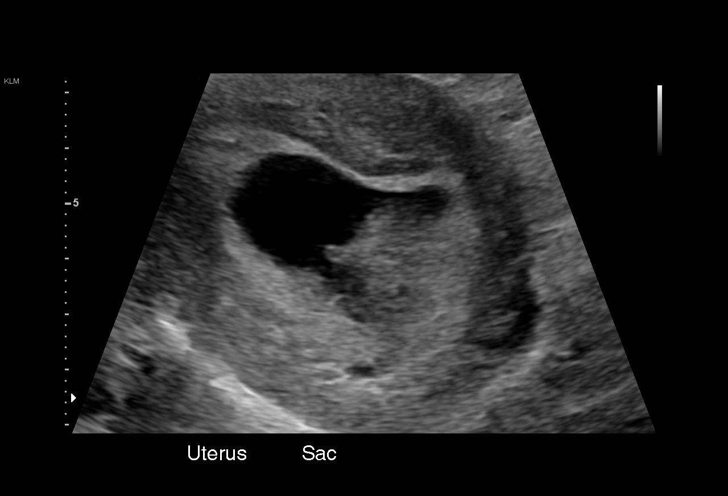
[im 18/19]
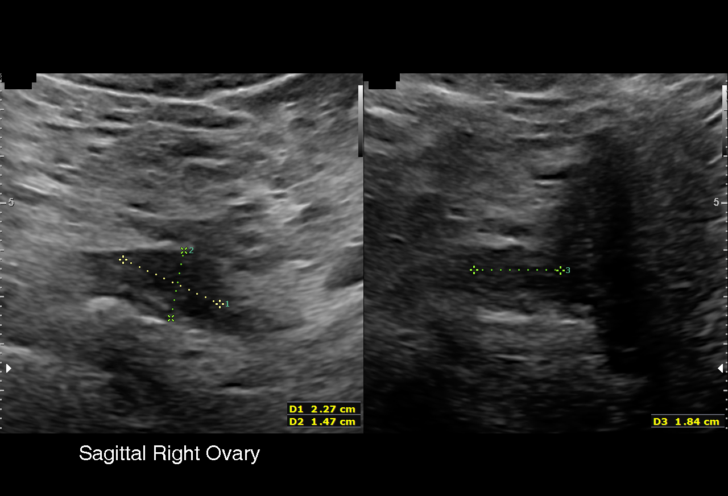
[im 19/19]
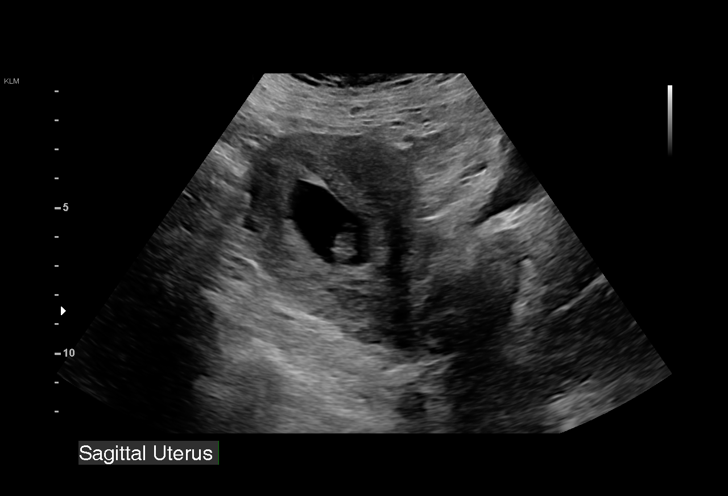

[15 of 19 positions shown; findings below may reference images not displayed]

FINDINGS: Intrauterine gestational sac: Present, single

Yolk sac:  Not visualized

Embryo:  Present

Cardiac Activity: Present

Heart Rate: 178 bpm

CRL:   24.3 mm   9 w 1 d                  US EDC: 04/03/2020

Subchorionic hemorrhage:  None visualized.

Maternal uterus/adnexae:

RIGHT ovary normal size and morphology 2.3 x 1.5 x 1.8 cm.

LEFT ovary normal size and morphology 2.2 x 2.3 x 1.9 cm.

No free pelvic fluid or adnexal masses.
IMPRESSION: Single live intrauterine gestation at 9 weeks 1 day EGA by
crown-rump length.

No acute abnormalities.

## 2020-10-06 IMAGING — US US OB COMP LESS 14 WK
1 series · 15 of 28 positions shown · non-contrast
Comparison: 08/31/2019

CLINICAL DATA: 43-year-old pregnant female with vaginal bleeding
and pelvic pain. Estimated gestational age 9 weeks 3 days by LMP.

EXAM:
OBSTETRIC <14 WK ULTRASOUND
TECHNIQUE: Transabdominal ultrasound was performed for evaluation of the
gestation as well as the maternal uterus and adnexal regions.

[Series 1: us ob comp less 14 wk · 28 acquisitions, 15 frames shown]
[im 1/28]
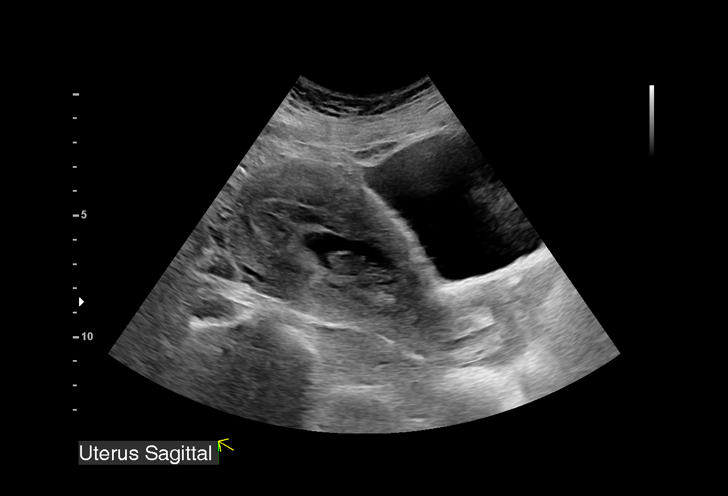
[im 3/28]
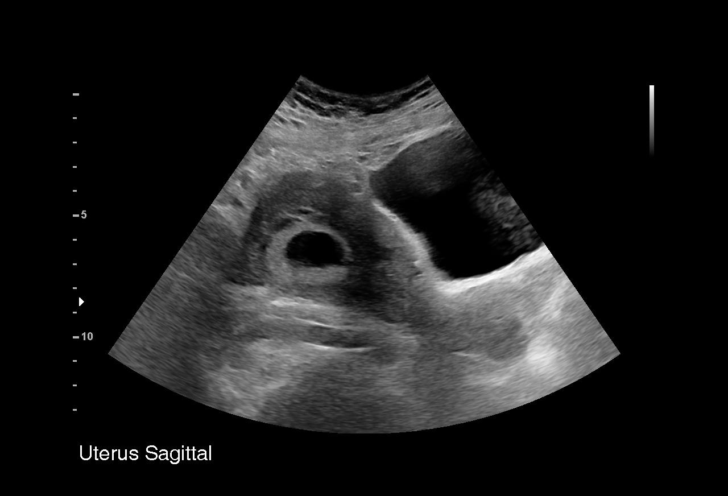
[im 5/28]
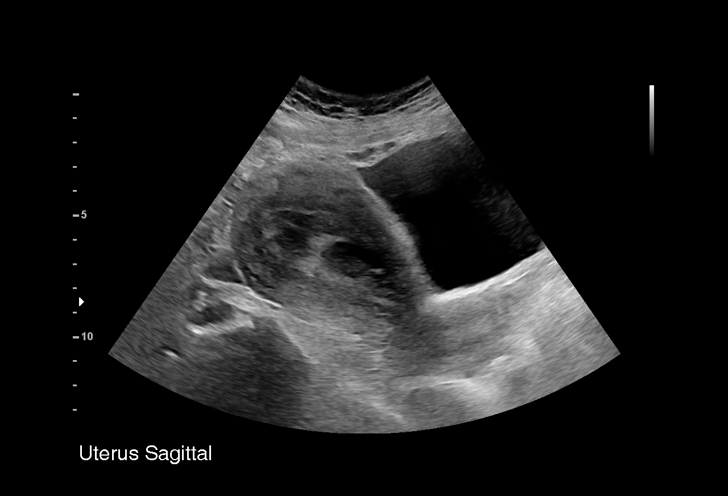
[im 7/28]
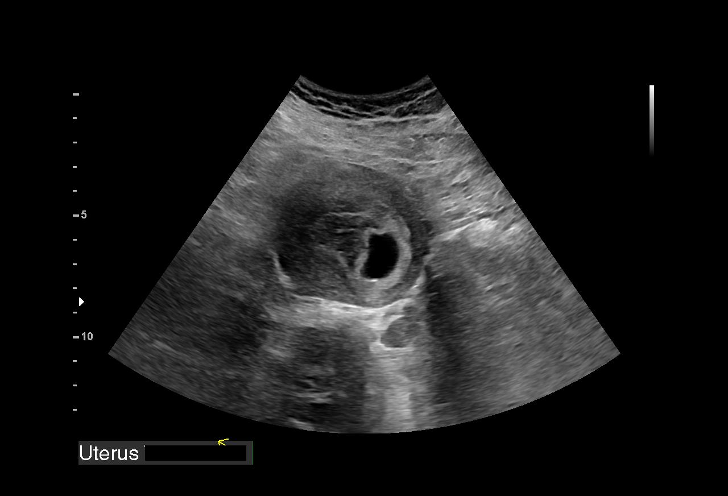
[im 9/28]
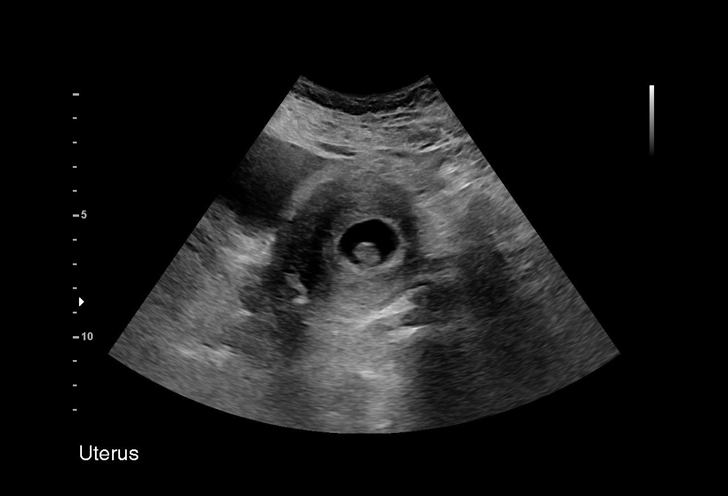
[im 11/28]
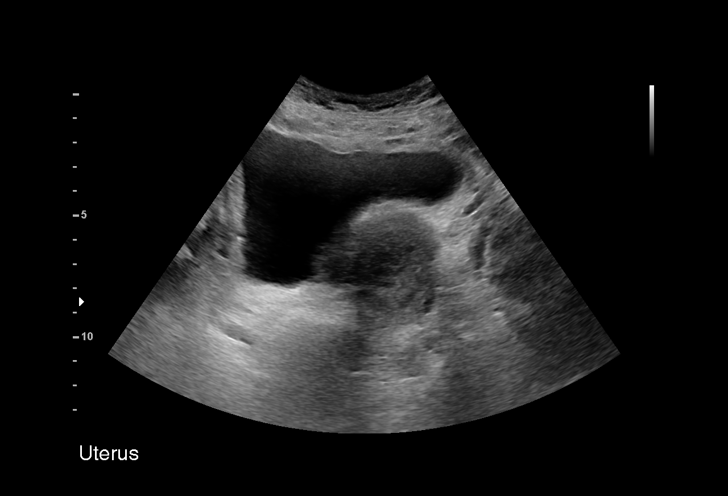
[im 13/28]
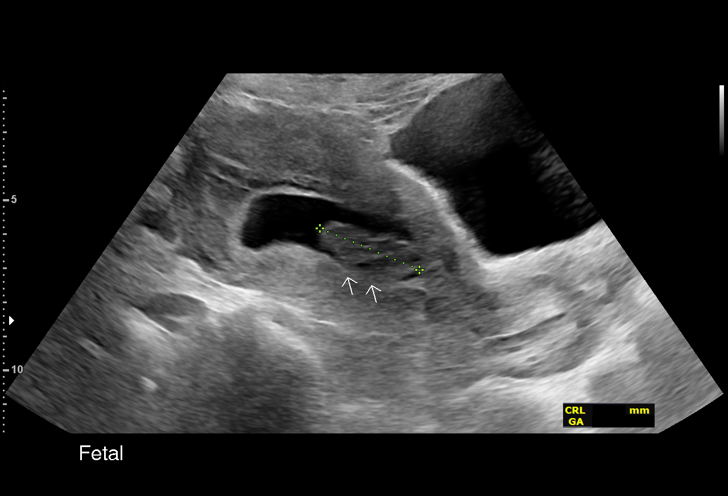
[im 15/28]
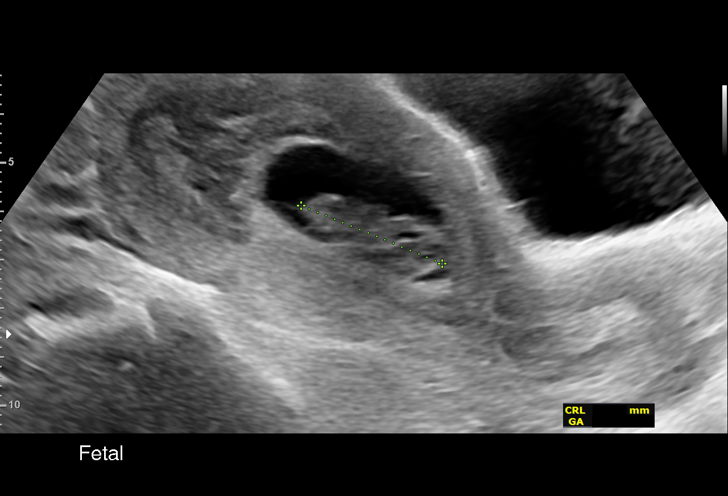
[im 16/28]
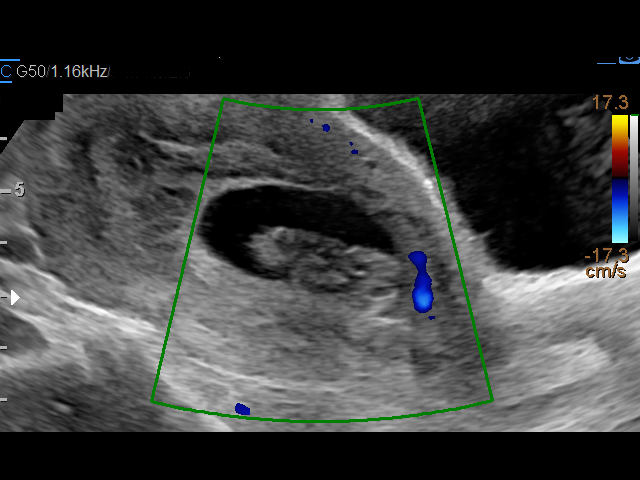
[im 18/28]
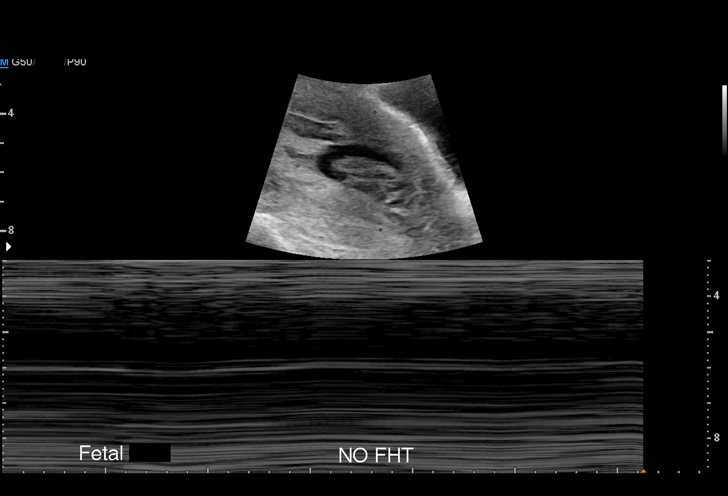
[im 20/28]
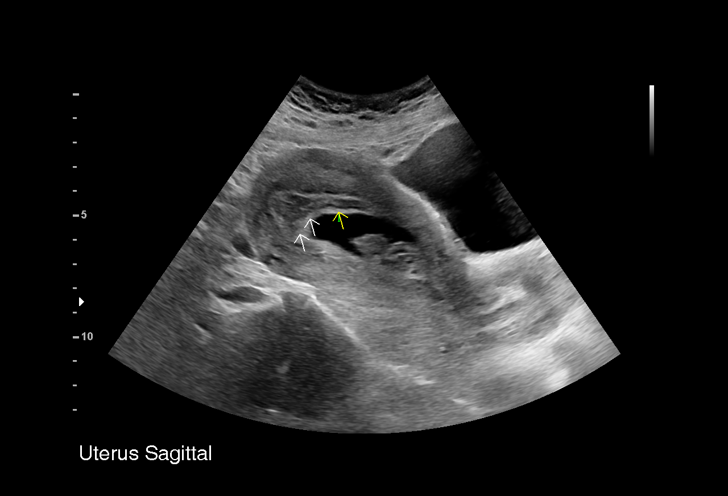
[im 22/28]
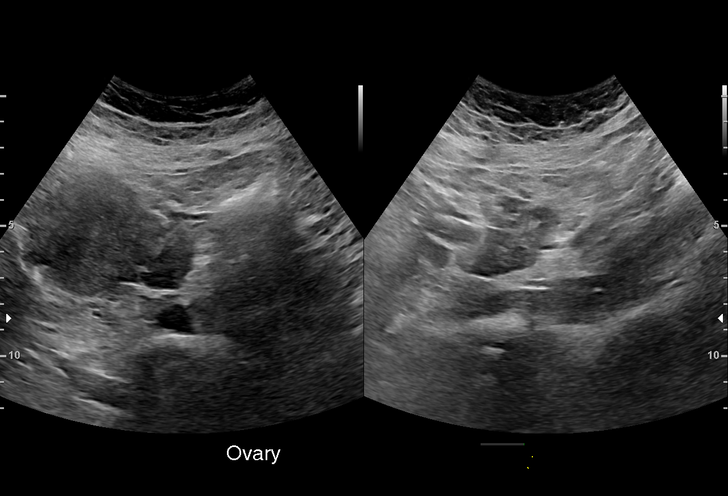
[im 24/28]
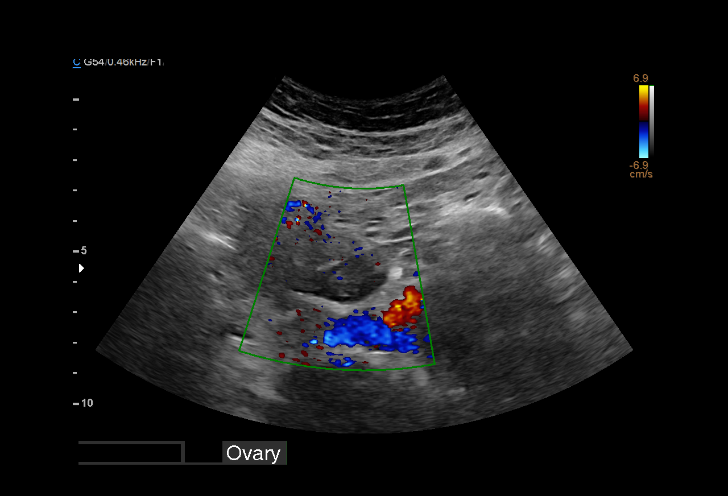
[im 26/28]
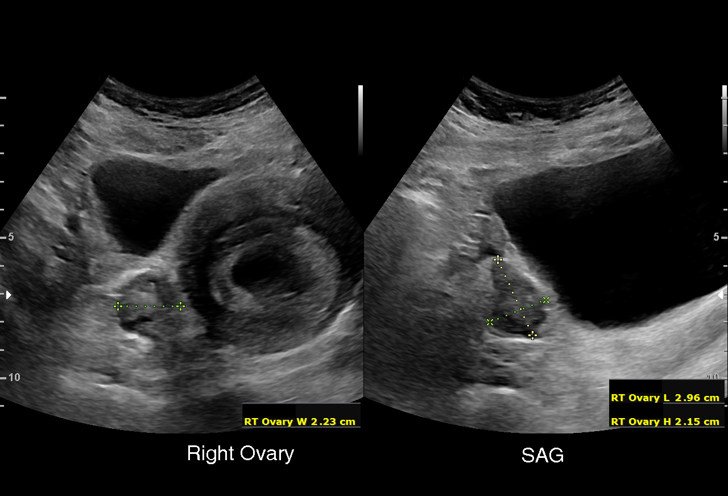
[im 28/28]
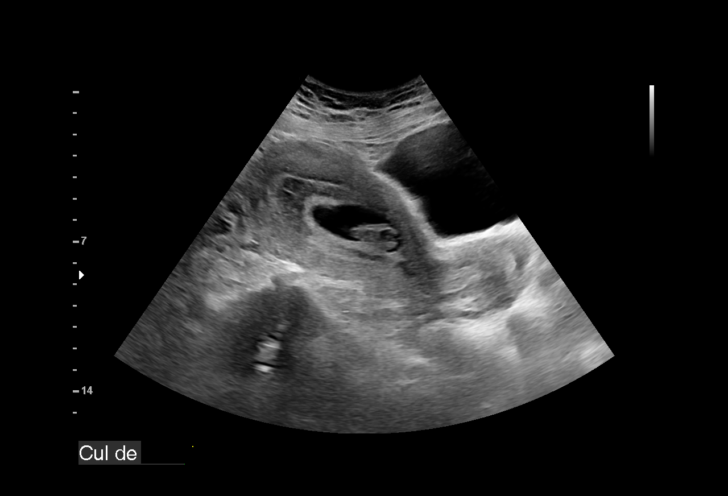

[15 of 28 positions shown; findings below may reference images not displayed]

FINDINGS: Intrauterine gestational sac: Single

Yolk sac:  Not Visualized.

Embryo:  Visualized.

Cardiac Activity: Not Visualized.

Heart Rate: 0 bpm

CRL:   31.3 mm   10 w 4 d

Subchorionic hemorrhage: A small to moderate subchorionic hemorrhage
is noted.

Maternal uterus/adnexae: No significant abnormality. No free fluid
or adnexal mass.
IMPRESSION: 1. Single intrauterine gestation, now without fetal heart tones
since 08/31/2019, compatible with fetal demise. Consider follow-up
as indicated.
2. Small to moderate subchorionic hemorrhage.
